# Patient Record
Sex: Female | Born: 1945 | Race: White | Hispanic: No | Marital: Married | Smoking: Never smoker
Health system: Southern US, Community
[De-identification: ages and names within clinical notes are randomized; demographics above are authoritative.]

## PROBLEM LIST (undated history)

## (undated) DIAGNOSIS — E039 Hypothyroidism, unspecified: Secondary | ICD-10-CM

## (undated) DIAGNOSIS — E042 Nontoxic multinodular goiter: Secondary | ICD-10-CM

## (undated) DIAGNOSIS — E038 Other specified hypothyroidism: Secondary | ICD-10-CM

## (undated) DIAGNOSIS — J019 Acute sinusitis, unspecified: Secondary | ICD-10-CM

## (undated) HISTORY — DX: Other specified hypothyroidism: E03.8

## (undated) HISTORY — DX: Nontoxic multinodular goiter: E04.2

## (undated) HISTORY — DX: Hypothyroidism, unspecified: E03.9

## (undated) HISTORY — DX: Acute sinusitis, unspecified: J01.90

---

## 2003-05-11 ENCOUNTER — Other Ambulatory Visit: Admission: RE | Admit: 2003-05-11 | Discharge: 2003-05-11 | Payer: Self-pay | Admitting: Obstetrics and Gynecology

## 2004-01-01 ENCOUNTER — Ambulatory Visit: Payer: Self-pay | Admitting: Internal Medicine

## 2004-03-12 ENCOUNTER — Ambulatory Visit: Payer: Self-pay | Admitting: Internal Medicine

## 2004-05-06 ENCOUNTER — Ambulatory Visit: Payer: Self-pay | Admitting: Internal Medicine

## 2004-06-03 ENCOUNTER — Ambulatory Visit: Payer: Self-pay | Admitting: Internal Medicine

## 2004-12-30 ENCOUNTER — Ambulatory Visit: Payer: Self-pay | Admitting: Internal Medicine

## 2005-01-28 ENCOUNTER — Ambulatory Visit: Payer: Self-pay | Admitting: Internal Medicine

## 2005-07-10 ENCOUNTER — Ambulatory Visit: Payer: Self-pay | Admitting: Internal Medicine

## 2005-07-15 ENCOUNTER — Ambulatory Visit: Payer: Self-pay | Admitting: Internal Medicine

## 2006-04-07 ENCOUNTER — Ambulatory Visit: Payer: Self-pay | Admitting: Internal Medicine

## 2006-04-07 LAB — CONVERTED CEMR LAB
AST: 20 units/L (ref 0–37)
Albumin: 3.9 g/dL (ref 3.5–5.2)
Bilirubin, Direct: 0.1 mg/dL (ref 0.0–0.3)
Cholesterol: 186 mg/dL (ref 0–200)
HDL: 49.2 mg/dL (ref >39.0)
Iron: 75 ug/dL (ref 42–145)
LDL Cholesterol: 117 mg/dL — ABNORMAL HIGH (ref 0–99)
TSH: 0.46 microintl units/mL (ref 0.35–5.50)
Total Bilirubin: 0.8 mg/dL (ref 0.3–1.2)
Total CHOL/HDL Ratio: 3.8
Total Protein: 6.2 g/dL (ref 6.0–8.3)
Transferrin: 192.7 mg/dL — ABNORMAL LOW (ref >=212.0)
VLDL: 20 mg/dL (ref 0–40)

## 2006-07-28 ENCOUNTER — Ambulatory Visit: Payer: Self-pay | Admitting: Internal Medicine

## 2007-03-24 ENCOUNTER — Ambulatory Visit: Payer: Self-pay | Admitting: Internal Medicine

## 2007-03-26 ENCOUNTER — Ambulatory Visit: Payer: Self-pay | Admitting: Internal Medicine

## 2007-03-26 DIAGNOSIS — J019 Acute sinusitis, unspecified: Secondary | ICD-10-CM | POA: Insufficient documentation

## 2007-03-26 DIAGNOSIS — E039 Hypothyroidism, unspecified: Secondary | ICD-10-CM | POA: Insufficient documentation

## 2007-04-01 ENCOUNTER — Encounter: Admission: RE | Admit: 2007-04-01 | Discharge: 2007-04-01 | Payer: Self-pay | Admitting: Internal Medicine

## 2007-05-03 ENCOUNTER — Ambulatory Visit: Payer: Self-pay | Admitting: Endocrinology

## 2007-05-03 DIAGNOSIS — E042 Nontoxic multinodular goiter: Secondary | ICD-10-CM | POA: Insufficient documentation

## 2007-06-28 ENCOUNTER — Ambulatory Visit: Payer: Self-pay | Admitting: Internal Medicine

## 2007-06-28 LAB — CONVERTED CEMR LAB
Direct LDL: 124.5 mg/dL
HDL: 39.7 mg/dL (ref >39.0)
TSH: 0.52 microintl units/mL (ref 0.35–5.50)
Total CHOL/HDL Ratio: 5.7
Triglycerides: 322 mg/dL (ref 0–149)
VLDL: 64 mg/dL — ABNORMAL HIGH (ref 0–40)

## 2007-07-27 ENCOUNTER — Encounter: Payer: Self-pay | Admitting: *Deleted

## 2008-09-19 ENCOUNTER — Encounter (INDEPENDENT_AMBULATORY_CARE_PROVIDER_SITE_OTHER): Payer: Self-pay | Admitting: *Deleted

## 2008-09-19 ENCOUNTER — Telehealth: Payer: Self-pay | Admitting: Internal Medicine

## 2008-09-26 ENCOUNTER — Ambulatory Visit: Payer: Self-pay | Admitting: Internal Medicine

## 2008-10-02 ENCOUNTER — Ambulatory Visit: Payer: Self-pay | Admitting: Internal Medicine

## 2008-10-02 DIAGNOSIS — H6120 Impacted cerumen, unspecified ear: Secondary | ICD-10-CM | POA: Insufficient documentation

## 2009-04-09 IMAGING — US US SOFT TISSUE HEAD/NECK
1 series · 14 of 25 positions shown · non-contrast
Comparison: none

CLINICAL DATA: Question, right thyroid nodule on physical exam.
 THYROID ULTRASOUND:
TECHNIQUE: Ultrasound examination of the thyroid gland and adjacent soft tissue structures was performed.
 No comparison.

[Series 1: us soft tissue head/neck · 0.05mm/px · 14 of 55 slices shown]
[im 1/55]
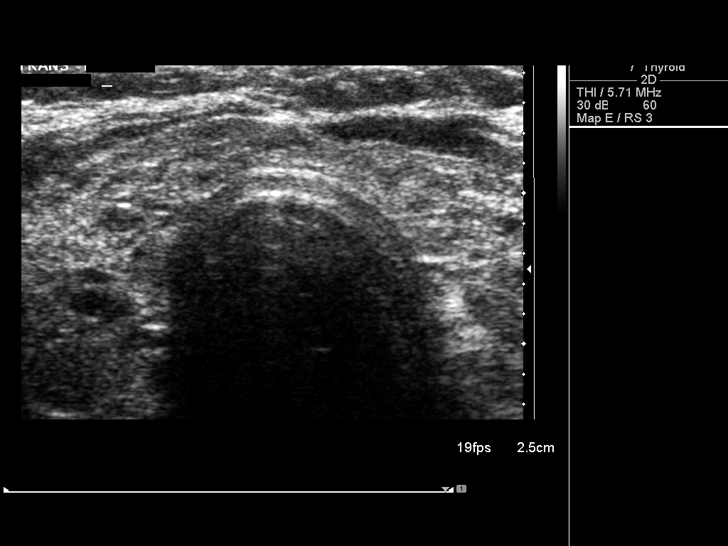
[im 5/55]
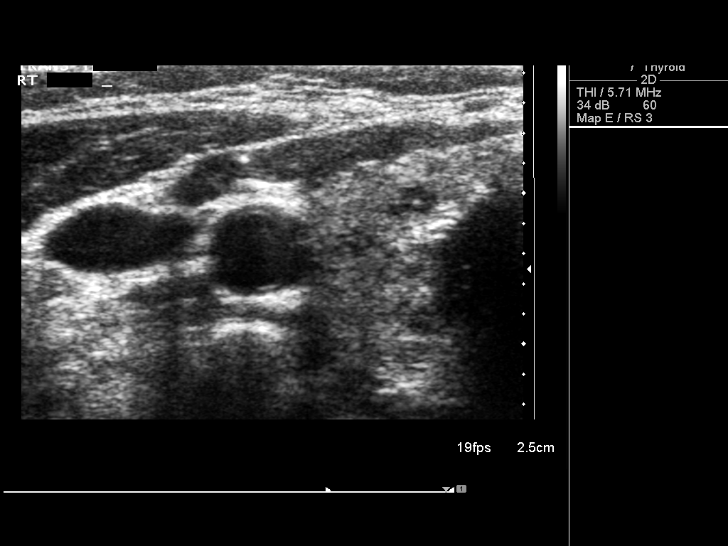
[im 10/55]
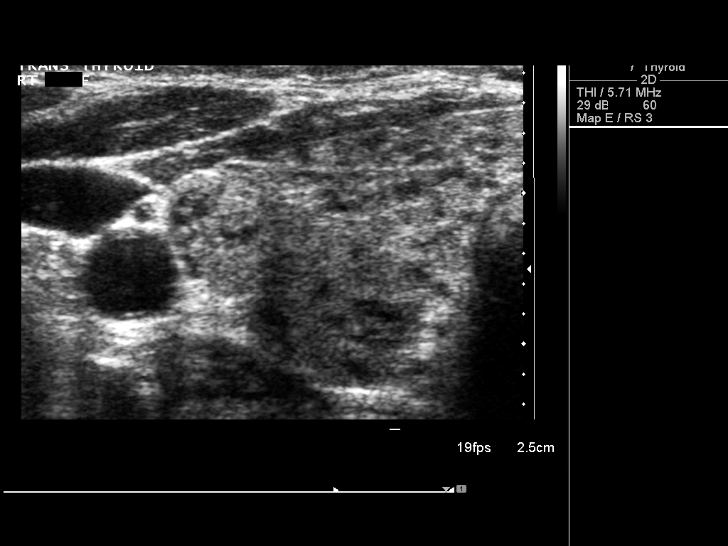
[im 14/55]
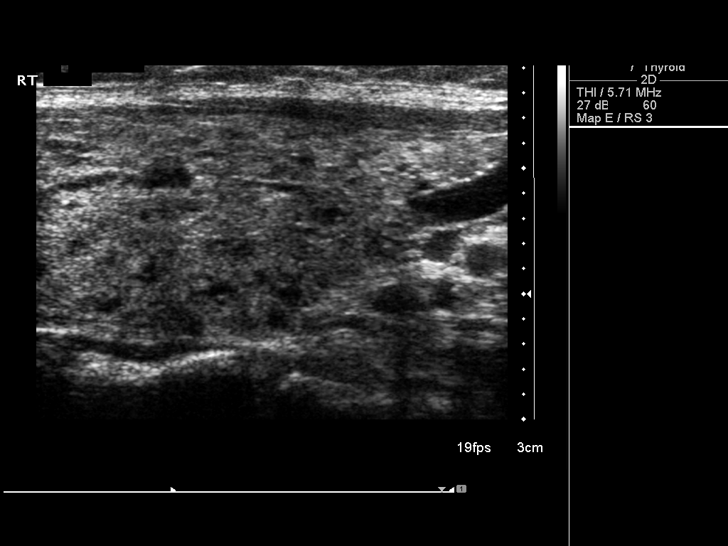
[im 19/55]
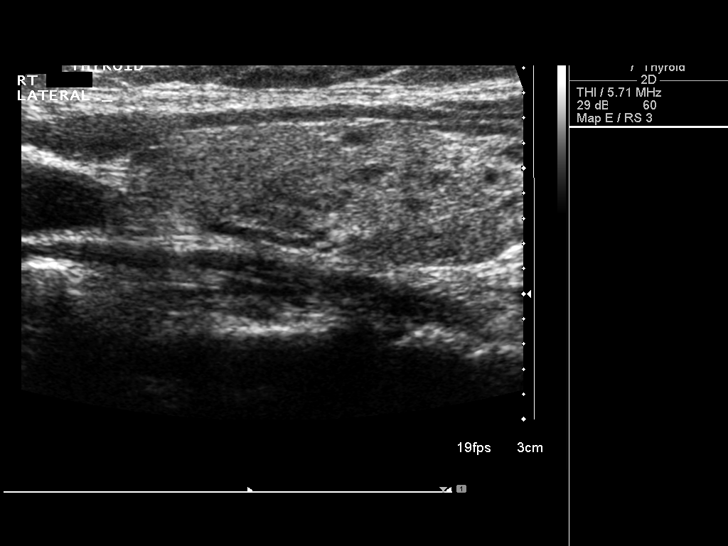
[im 21/55]
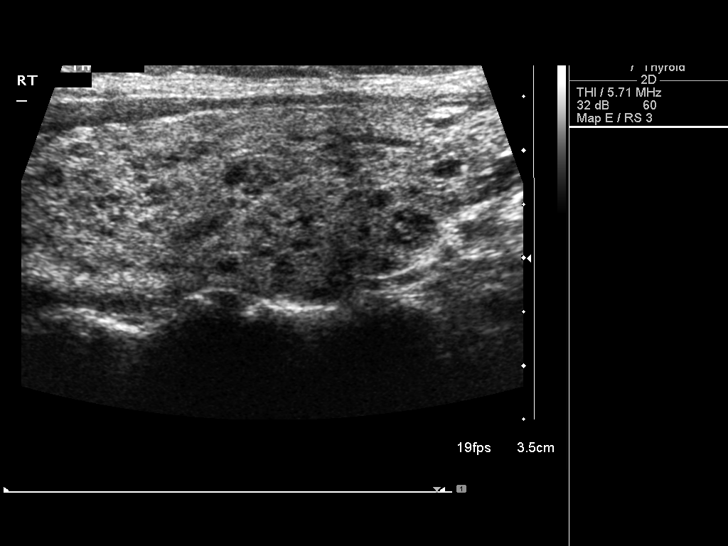
[im 25/55]
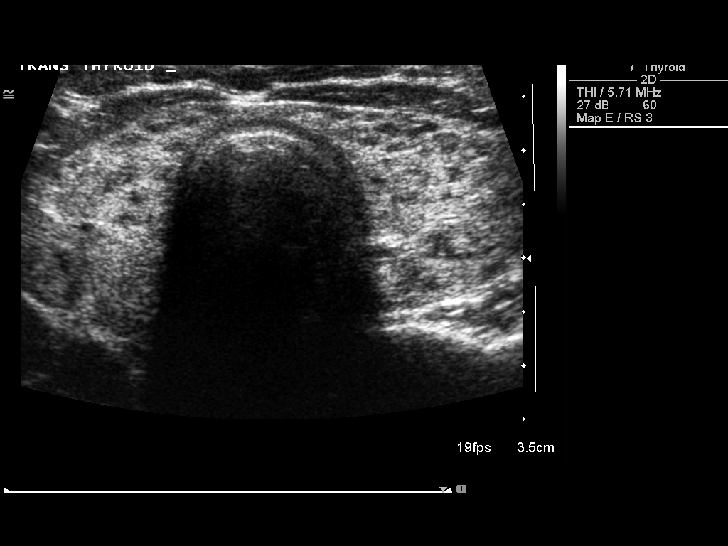
[im 30/55]
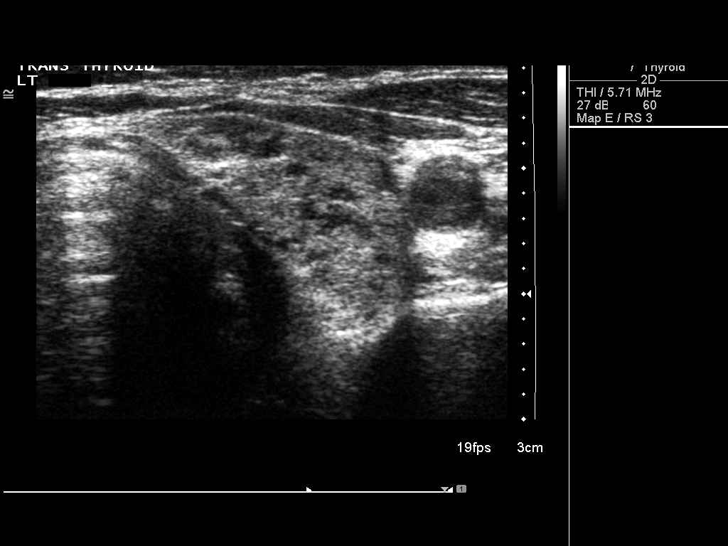
[im 34/55]
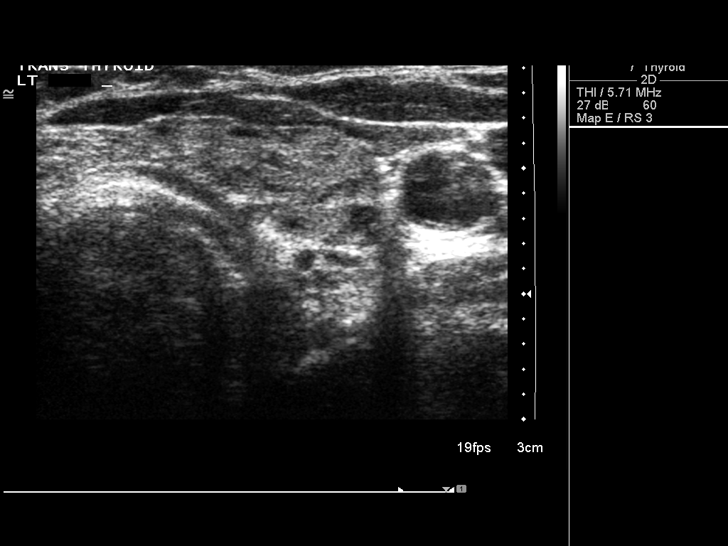
[im 37/55]
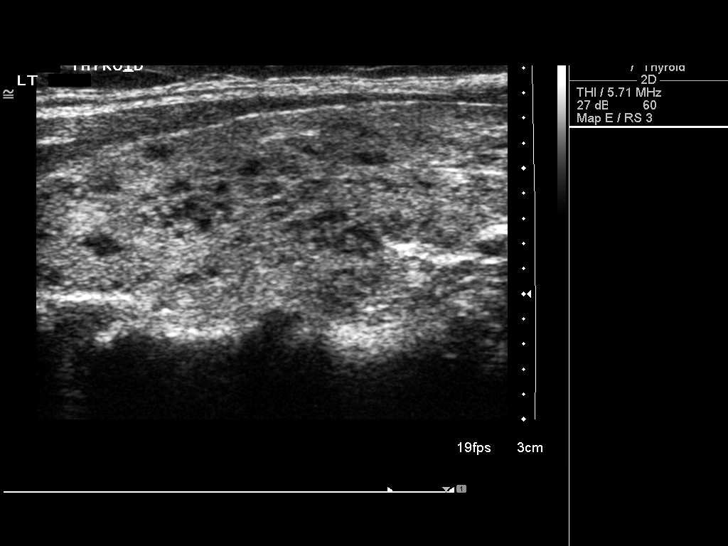
[im 41/55]
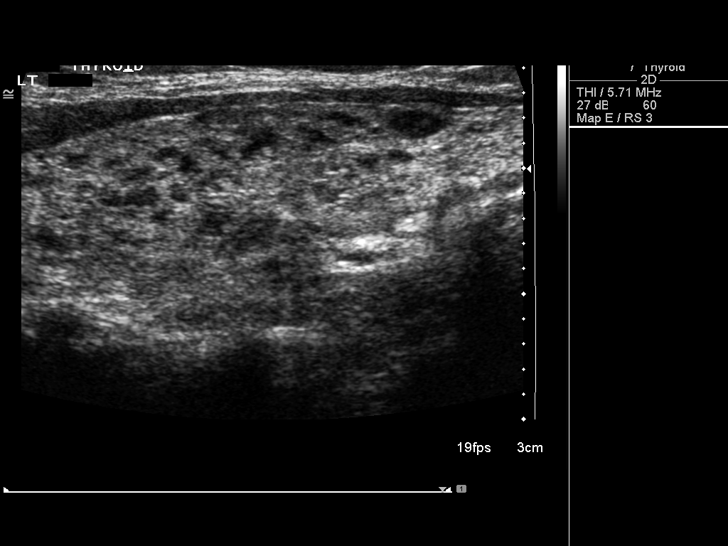
[im 46/55]
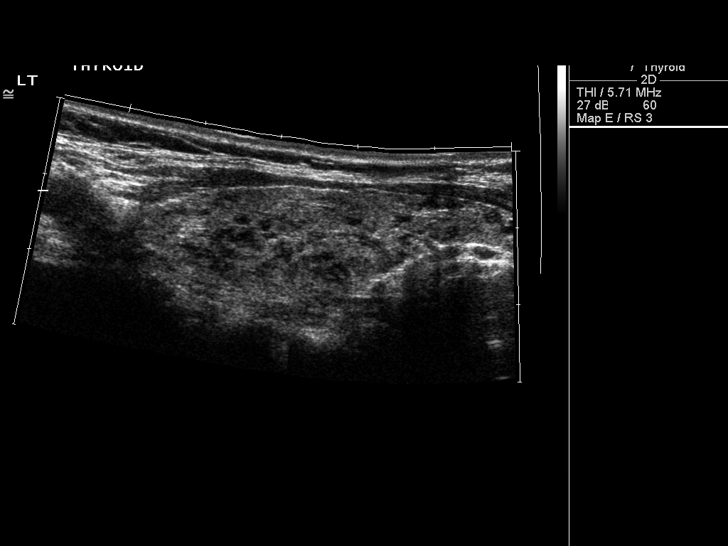
[im 50/55]
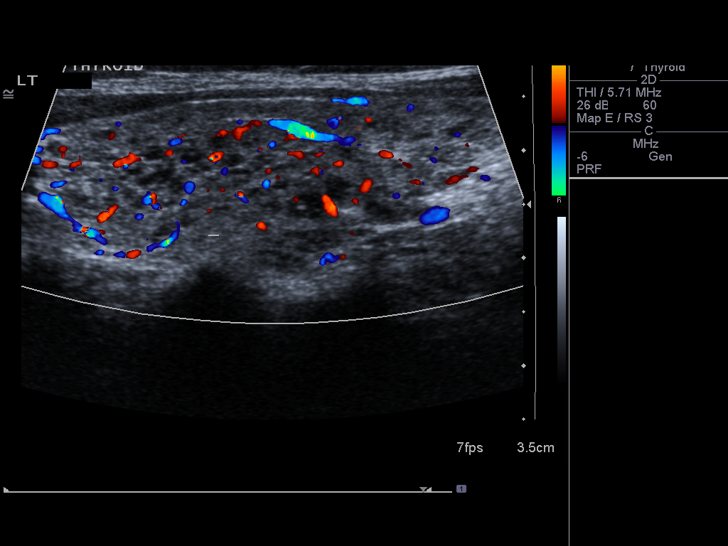
[im 55/55]
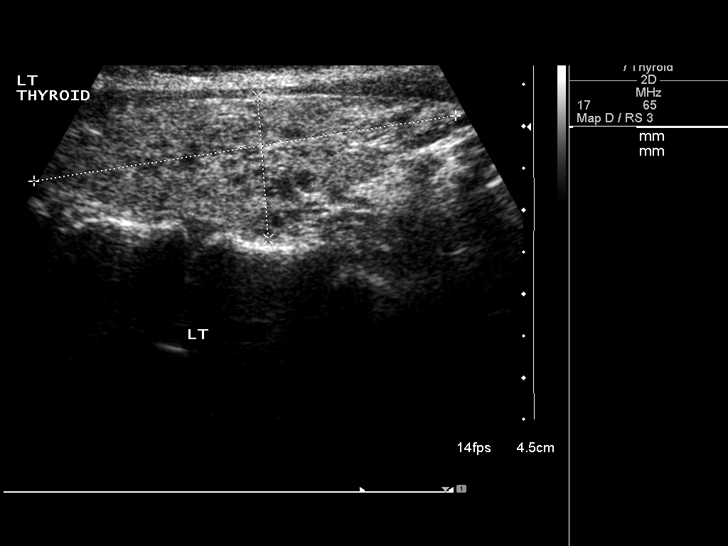

[14 of 25 positions shown; findings below may reference images not displayed]

FINDINGS: The thyroid gland is upper limits of normal in size diffusely with right lobe measuring 5.3 cm long x 1.6 cm AP x 2.1 cm wide and left lobe 5.3 cm long x 1.6 cm AP x 1.7 cm wide and isthmus 4 mm AP.  Thyroid echo texture is diffusely inhomogeneous.  At the medial mid left lobe, is discrete hypoechoic nodule measuring 5 mm long x 3 mm AP x 6 mm wide favoring adenoma.
IMPRESSION: Probable subtle multinodular goiter with 6 mm likely adenoma at the medial mid left lobe.   No other dominant solid lesion seen.

## 2010-01-08 ENCOUNTER — Telehealth: Payer: Self-pay | Admitting: Internal Medicine

## 2010-03-17 ENCOUNTER — Encounter: Payer: Self-pay | Admitting: Internal Medicine

## 2010-03-18 ENCOUNTER — Encounter: Payer: Self-pay | Admitting: Endocrinology

## 2010-03-26 NOTE — Progress Notes (Signed)
Summary: Needs appt  ---- Converted from flag ---- ---- 01/07/2010 2:27 PM, Verdell Face wrote: I called and pt is in [place],and will call in December when she returns to [facility].  ---- 01/07/2010 12:00 PM, Lanier Prude, [facility]) wrote: Please sched pt for OV.  Last seen 09/2008   Thx! ------------------------------

## 2010-06-10 ENCOUNTER — Other Ambulatory Visit: Payer: Self-pay | Admitting: Internal Medicine

## 2010-09-02 ENCOUNTER — Encounter: Payer: Self-pay | Admitting: Internal Medicine

## 2010-09-03 ENCOUNTER — Encounter: Payer: Self-pay | Admitting: Internal Medicine

## 2010-09-04 ENCOUNTER — Encounter: Payer: Self-pay | Admitting: Internal Medicine

## 2010-09-05 ENCOUNTER — Ambulatory Visit (INDEPENDENT_AMBULATORY_CARE_PROVIDER_SITE_OTHER): Payer: PRIVATE HEALTH INSURANCE | Admitting: Internal Medicine

## 2010-09-05 ENCOUNTER — Other Ambulatory Visit (INDEPENDENT_AMBULATORY_CARE_PROVIDER_SITE_OTHER): Payer: PRIVATE HEALTH INSURANCE

## 2010-09-05 ENCOUNTER — Encounter: Payer: Self-pay | Admitting: Internal Medicine

## 2010-09-05 ENCOUNTER — Other Ambulatory Visit: Payer: Self-pay | Admitting: Internal Medicine

## 2010-09-05 VITALS — BP 128/88 | HR 80 | Temp 98.4°F | Resp 16 | Ht 60.0 in | Wt 166.0 lb

## 2010-09-05 DIAGNOSIS — E039 Hypothyroidism, unspecified: Secondary | ICD-10-CM

## 2010-09-05 DIAGNOSIS — Z136 Encounter for screening for cardiovascular disorders: Secondary | ICD-10-CM

## 2010-09-05 DIAGNOSIS — Z0000 Encounter for general adult medical examination without abnormal findings: Secondary | ICD-10-CM

## 2010-09-05 DIAGNOSIS — L219 Seborrheic dermatitis, unspecified: Secondary | ICD-10-CM | POA: Insufficient documentation

## 2010-09-05 LAB — URINALYSIS
Bilirubin Urine: NEGATIVE
Leukocytes, UA: NEGATIVE
Nitrite: NEGATIVE
pH: 7 (ref 5.0–8.0)

## 2010-09-05 LAB — CBC WITH DIFFERENTIAL/PLATELET
Basophils Absolute: 0 10*3/uL (ref 0.0–0.1)
HCT: 44.3 % (ref 36.0–46.0)
Lymphs Abs: 2.3 10*3/uL (ref 0.7–4.0)
Monocytes Absolute: 0.6 10*3/uL (ref 0.1–1.0)
Monocytes Relative: 7.2 % (ref 3.0–12.0)
Neutrophils Relative %: 60.8 % (ref 43.0–77.0)
Platelets: 209 10*3/uL (ref 150.0–400.0)
RDW: 14.6 % (ref 11.5–14.6)
WBC: 7.8 10*3/uL (ref 4.5–10.5)

## 2010-09-05 LAB — COMPREHENSIVE METABOLIC PANEL
AST: 28 U/L (ref 0–37)
Albumin: 4.5 g/dL (ref 3.5–5.2)
Alkaline Phosphatase: 48 U/L (ref 39–117)
BUN: 14 mg/dL (ref 6–23)
Creatinine, Ser: 0.7 mg/dL (ref 0.4–1.2)
Glucose, Bld: 98 mg/dL (ref 70–99)
Potassium: 3.6 mEq/L (ref 3.5–5.1)
Total Bilirubin: 0.6 mg/dL (ref 0.3–1.2)

## 2010-09-05 LAB — LIPID PANEL
Cholesterol: 238 mg/dL — ABNORMAL HIGH (ref 0–200)
HDL: 56.6 mg/dL (ref >39.00)
Total CHOL/HDL Ratio: 4
Triglycerides: 182 mg/dL — ABNORMAL HIGH (ref 0.0–149.0)

## 2010-09-05 LAB — TSH: TSH: 15.36 u[IU]/mL — ABNORMAL HIGH (ref 0.35–5.50)

## 2010-09-05 MED ORDER — ASPIRIN 81 MG PO CHEW: 81.0000 mg | CHEWABLE_TABLET | Freq: Every day | ORAL | Status: DC

## 2010-09-05 MED ORDER — LEVOTHYROXINE SODIUM 50 MCG PO TABS: 50.0000 ug | ORAL_TABLET | Freq: Every day | ORAL | Status: DC

## 2010-09-05 MED ORDER — CLOTRIMAZOLE-BETAMETHASONE 1-0.05 % EX CREA: TOPICAL_CREAM | Freq: Two times a day (BID) | CUTANEOUS | Status: DC

## 2010-09-05 NOTE — Assessment & Plan Note (Signed)
Start Lotrisone

## 2010-09-05 NOTE — Progress Notes (Signed)
  Subjective:    Patient ID: Rachel Howard, female    DOB: [birth year], 65 y.o.   MRN: [id number]  HPI  The patient is here for a wellness exam. The patient has been doing well overall without major physical or psychological issues going on lately.    Review of Systems  Constitutional: Negative.  Negative for fever, chills, diaphoresis, activity change, appetite change, fatigue and unexpected weight change.  HENT: Negative for hearing loss, ear pain, nosebleeds, congestion, sore throat, facial swelling, rhinorrhea, sneezing, mouth sores, trouble swallowing, neck pain, neck stiffness, postnasal drip, sinus pressure and tinnitus.   Eyes: Negative for pain, discharge, redness, itching and visual disturbance.  Respiratory: Negative for cough, chest tightness, shortness of breath, wheezing and stridor.   Cardiovascular: Negative for chest pain, palpitations and leg swelling.  Gastrointestinal: Negative for nausea, diarrhea, constipation, blood in stool, abdominal distention, anal bleeding and rectal pain.  Genitourinary: Negative for dysuria, urgency, frequency, hematuria, flank pain, vaginal bleeding, vaginal discharge, difficulty urinating, genital sores and pelvic pain.  Musculoskeletal: Negative for back pain, joint swelling, arthralgias and gait problem.  Skin: Negative.  Negative for rash.  Neurological: Negative for dizziness, tremors, seizures, syncope, speech difficulty, weakness, numbness and headaches.  Hematological: Negative for adenopathy. Does not bruise/bleed easily.  Psychiatric/Behavioral: Negative for suicidal ideas, behavioral problems, sleep disturbance, dysphoric mood and decreased concentration. The patient is not nervous/anxious.        Objective:   Physical Exam  Constitutional: She appears well-developed and well-nourished. No distress.  HENT:  Head: Normocephalic.  Right Ear: External ear normal.  Left Ear: External ear normal.  Nose: Nose normal.    Mouth/Throat: Oropharynx is clear and moist.  Eyes: Conjunctivae are normal. Pupils are equal, round, and reactive to light. Right eye exhibits no discharge. Left eye exhibits no discharge.  Neck: Normal range of motion. Neck supple. No JVD present. No tracheal deviation present. No thyromegaly present.  Cardiovascular: Normal rate, regular rhythm and normal heart sounds.   Pulmonary/Chest: No stridor. No respiratory distress. She has no wheezes.  Abdominal: Soft. Bowel sounds are normal. She exhibits no distension and no mass. There is no tenderness. There is no rebound and no guarding.  Musculoskeletal: She exhibits no edema and no tenderness.  Lymphadenopathy:    She has no cervical adenopathy.  Neurological: She displays normal reflexes. No cranial nerve deficit. She exhibits normal muscle tone. Coordination normal.  Skin: No rash noted. No erythema.  Psychiatric: She has a normal mood and affect. Her behavior is normal. Judgment and thought content normal.          Assessment & Plan:

## 2010-09-05 NOTE — Assessment & Plan Note (Signed)
Labs ordered. EKG NSR/artifact

## 2010-09-05 NOTE — Assessment & Plan Note (Signed)
She stopped thyroid med in April - ran out

## 2010-09-05 NOTE — Patient Instructions (Signed)
You can try Ginko biloba for memory

## 2010-09-06 ENCOUNTER — Other Ambulatory Visit: Payer: Self-pay | Admitting: *Deleted

## 2010-09-06 ENCOUNTER — Telehealth: Payer: Self-pay | Admitting: Internal Medicine

## 2010-09-06 DIAGNOSIS — E039 Hypothyroidism, unspecified: Secondary | ICD-10-CM

## 2010-09-06 MED ORDER — LEVOTHYROXINE SODIUM 50 MCG PO TABS: 50.0000 ug | ORAL_TABLET | Freq: Every day | ORAL | Status: DC

## 2010-09-06 NOTE — Telephone Encounter (Signed)
Pt informed/copies mailed to pt per her req.

## 2010-09-06 NOTE — Telephone Encounter (Signed)
Rachel Howard, please, inform patient that all labs are normal except for a little elev chol and abn TSH. Pls restart your Levoxyl and check TSH in 2 mo Thx

## 2010-12-03 ENCOUNTER — Other Ambulatory Visit (INDEPENDENT_AMBULATORY_CARE_PROVIDER_SITE_OTHER): Payer: PRIVATE HEALTH INSURANCE

## 2010-12-03 DIAGNOSIS — E039 Hypothyroidism, unspecified: Secondary | ICD-10-CM

## 2010-12-04 ENCOUNTER — Telehealth: Payer: Self-pay | Admitting: Internal Medicine

## 2010-12-04 MED ORDER — LEVOTHYROXINE SODIUM 75 MCG PO TABS: 75.0000 ug | ORAL_TABLET | Freq: Every day | ORAL | Status: DC

## 2010-12-04 NOTE — Telephone Encounter (Signed)
Misty Stanley, please, inform patient that we need to increase  Thx

## 2010-12-05 ENCOUNTER — Telehealth: Payer: Self-pay | Admitting: *Deleted

## 2010-12-05 DIAGNOSIS — E039 Hypothyroidism, unspecified: Secondary | ICD-10-CM

## 2010-12-05 NOTE — Telephone Encounter (Signed)
Pt informed

## 2010-12-05 NOTE — Telephone Encounter (Signed)
Yes, pls: Dec 2012 Thx

## 2010-12-05 NOTE — Telephone Encounter (Signed)
Pt asking when she needs to have TSH checked again? Before next OV in 02/2011?

## 2010-12-06 NOTE — Telephone Encounter (Signed)
Left detailed mess informing pt of below. Orders entered for lab.

## 2011-02-20 ENCOUNTER — Other Ambulatory Visit (INDEPENDENT_AMBULATORY_CARE_PROVIDER_SITE_OTHER): Payer: PRIVATE HEALTH INSURANCE

## 2011-02-20 DIAGNOSIS — E039 Hypothyroidism, unspecified: Secondary | ICD-10-CM

## 2011-02-21 ENCOUNTER — Telehealth: Payer: Self-pay | Admitting: Internal Medicine

## 2011-02-21 NOTE — Telephone Encounter (Signed)
Rachel Howard, please, inform patient that her TSH is nl Thx

## 2011-02-21 NOTE — Telephone Encounter (Signed)
Pt informed

## 2011-03-12 ENCOUNTER — Ambulatory Visit: Payer: PRIVATE HEALTH INSURANCE | Admitting: Internal Medicine

## 2011-09-29 ENCOUNTER — Encounter: Payer: Self-pay | Admitting: Internal Medicine

## 2011-09-29 ENCOUNTER — Ambulatory Visit (INDEPENDENT_AMBULATORY_CARE_PROVIDER_SITE_OTHER): Payer: PRIVATE HEALTH INSURANCE | Admitting: Internal Medicine

## 2011-09-29 VITALS — BP 120/82 | HR 80 | Temp 97.2°F | Resp 16 | Wt 161.0 lb

## 2011-09-29 DIAGNOSIS — R21 Rash and other nonspecific skin eruption: Secondary | ICD-10-CM | POA: Insufficient documentation

## 2011-09-29 DIAGNOSIS — E039 Hypothyroidism, unspecified: Secondary | ICD-10-CM

## 2011-09-29 MED ORDER — METHYLPREDNISOLONE ACETATE 80 MG/ML IJ SUSP
120.0000 mg | Freq: Once | INTRAMUSCULAR | Status: AC
  Administered 2011-09-29: 120 mg via INTRAMUSCULAR

## 2011-09-29 MED ORDER — BETAMETHASONE DIPROPIONATE AUG 0.05 % EX CREA: TOPICAL_CREAM | Freq: Three times a day (TID) | CUTANEOUS | Status: DC

## 2011-09-29 MED ORDER — LORATADINE 10 MG PO TABS: 10.0000 mg | ORAL_TABLET | Freq: Every day | ORAL | Status: DC

## 2011-09-29 NOTE — Progress Notes (Signed)
Subjective:    Patient ID: Rachel Howard, female    DOB: [birth year], 66 y.o.   MRN: [id number]  Rash This is a new problem. The current episode started 1 to 4 weeks ago (7/20). The problem has been gradually worsening since onset. The affected locations include the abdomen, left lower leg and right lower leg. The rash is characterized by blistering, burning and draining. Associated with: she was dx'd w/a spider bite by UC. Pertinent negatives include no congestion, cough, diarrhea, eye pain, fatigue, fever, rhinorrhea, shortness of breath or sore throat. Past treatments include antibiotics, antihistamine, anti-itch cream and oral steroids. The treatment provided mild relief.  F/u hypothyroidism She took Bactrim and Predn taper   BP Readings from Last 3 Encounters:  09/29/11 120/82  09/05/10 128/88  10/02/08 126/74   Wt Readings from Last 3 Encounters:  09/29/11 161 lb (73.029 kg)  09/05/10 166 lb (75.297 kg)  10/02/08 165 lb (74.844 kg)      Review of Systems  Constitutional: Negative.  Negative for fever, chills, diaphoresis, activity change, appetite change, fatigue and unexpected weight change.  HENT: Negative for hearing loss, ear pain, nosebleeds, congestion, sore throat, facial swelling, rhinorrhea, sneezing, mouth sores, trouble swallowing, neck pain, neck stiffness, postnasal drip, sinus pressure and tinnitus.   Eyes: Negative for pain, discharge, redness, itching and visual disturbance.  Respiratory: Negative for cough, chest tightness, shortness of breath, wheezing and stridor.   Cardiovascular: Negative for chest pain, palpitations and leg swelling.  Gastrointestinal: Negative for nausea, diarrhea, constipation, blood in stool, abdominal distention, anal bleeding and rectal pain.  Genitourinary: Negative for dysuria, urgency, frequency, hematuria, flank pain, vaginal bleeding, vaginal discharge, difficulty urinating, genital sores and pelvic pain.  Musculoskeletal:  Negative for back pain, joint swelling, arthralgias and gait problem.  Skin: Positive for rash.  Neurological: Negative for dizziness, tremors, seizures, syncope, speech difficulty, weakness, numbness and headaches.  Hematological: Negative for adenopathy. Does not bruise/bleed easily.  Psychiatric/Behavioral: Negative for suicidal ideas, behavioral problems, disturbed wake/sleep cycle, dysphoric mood and decreased concentration. The patient is not nervous/anxious.        Objective:   Physical Exam  Constitutional: She appears well-developed and well-nourished. No distress.  HENT:  Head: Normocephalic.  Right Ear: External ear normal.  Left Ear: External ear normal.  Nose: Nose normal.  Mouth/Throat: Oropharynx is clear and moist.  Eyes: Conjunctivae are normal. Pupils are equal, round, and reactive to light. Right eye exhibits no discharge. Left eye exhibits no discharge.  Neck: Normal range of motion. Neck supple. No JVD present. No tracheal deviation present. No thyromegaly present.  Cardiovascular: Normal rate, regular rhythm and normal heart sounds.   Pulmonary/Chest: No stridor. No respiratory distress. She has no wheezes.  Abdominal: Soft. Bowel sounds are normal. She exhibits no distension and no mass. There is no tenderness. There is no rebound and no guarding.  Musculoskeletal: She exhibits no edema and no tenderness.  Lymphadenopathy:    She has no cervical adenopathy.  Neurological: She displays normal reflexes. No cranial nerve deficit. She exhibits normal muscle tone. Coordination normal.  Skin: Rash noted. There is erythema.  Psychiatric: She has a normal mood and affect. Her behavior is normal. Judgment and thought content normal.  Extensive papules and scabs 7-15 mm with surrounding erythema, confluent in some areas on B LEs and abdomen, NT  Lab Results  Component Value Date   WBC 7.8 09/05/2010   HGB 15.1* 09/05/2010   HCT 44.3 09/05/2010   PLT 209.0 09/05/2010  GLUCOSE 98 09/05/2010   CHOL 238* 09/05/2010   TRIG 182.0* 09/05/2010   HDL 56.60 09/05/2010   LDLDIRECT 150.8 09/05/2010   LDLCALC 117* 04/07/2006   ALT 28 09/05/2010   AST 28 09/05/2010   NA 137 09/05/2010   K 3.6 09/05/2010   CL 102 09/05/2010   CREATININE 0.7 09/05/2010   BUN 14 09/05/2010   CO2 26 09/05/2010   TSH 2.52 02/20/2011   INR 1.1 RATIO 04/07/2006           Assessment & Plan:

## 2011-09-29 NOTE — Assessment & Plan Note (Signed)
8/13 - contact dermatitis, insect bites No signs of infection at the moment  Depomedrol 120 mg im Loratidine qd Diprolene tid

## 2011-09-29 NOTE — Assessment & Plan Note (Signed)
Lotrisone bid  

## 2011-09-29 NOTE — Assessment & Plan Note (Signed)
Continue with current prescription therapy as reflected on the Med list. TSH was ordered

## 2011-12-04 ENCOUNTER — Other Ambulatory Visit (INDEPENDENT_AMBULATORY_CARE_PROVIDER_SITE_OTHER): Payer: PRIVATE HEALTH INSURANCE

## 2011-12-04 DIAGNOSIS — E039 Hypothyroidism, unspecified: Secondary | ICD-10-CM

## 2011-12-04 DIAGNOSIS — R21 Rash and other nonspecific skin eruption: Secondary | ICD-10-CM

## 2011-12-04 LAB — BASIC METABOLIC PANEL
BUN: 13 mg/dL (ref 6–23)
CO2: 27 mEq/L (ref 19–32)
Chloride: 107 mEq/L (ref 96–112)
Glucose, Bld: 92 mg/dL (ref 70–99)
Potassium: 4.3 mEq/L (ref 3.5–5.1)

## 2011-12-05 ENCOUNTER — Other Ambulatory Visit: Payer: Self-pay | Admitting: Internal Medicine

## 2011-12-05 MED ORDER — LEVOTHYROXINE SODIUM 75 MCG PO TABS: 75.0000 ug | ORAL_TABLET | Freq: Every day | ORAL | Status: DC

## 2012-05-03 ENCOUNTER — Telehealth: Payer: Self-pay | Admitting: Internal Medicine

## 2012-05-03 DIAGNOSIS — E039 Hypothyroidism, unspecified: Secondary | ICD-10-CM

## 2012-05-03 DIAGNOSIS — E785 Hyperlipidemia, unspecified: Secondary | ICD-10-CM

## 2012-05-03 DIAGNOSIS — Z0000 Encounter for general adult medical examination without abnormal findings: Secondary | ICD-10-CM

## 2012-05-03 NOTE — Telephone Encounter (Signed)
The patient came in the office and made a cpe apt for tomorrow am.  She stated she needed a cpe as soon as possible due to a trip she is taking.  She is hoping to go down to the lab for a "tider" test before her cpe apt.  Due to a language barrier, I could not understand exactly what type of test she is hoping for.

## 2012-05-03 NOTE — Telephone Encounter (Signed)
"  thyroid" she meant. OK --- TSH, CMET, CBC, lipids, UA Thx

## 2012-05-04 ENCOUNTER — Encounter: Payer: Self-pay | Admitting: Internal Medicine

## 2012-05-04 ENCOUNTER — Ambulatory Visit (INDEPENDENT_AMBULATORY_CARE_PROVIDER_SITE_OTHER): Payer: PRIVATE HEALTH INSURANCE | Admitting: Internal Medicine

## 2012-05-04 ENCOUNTER — Other Ambulatory Visit (INDEPENDENT_AMBULATORY_CARE_PROVIDER_SITE_OTHER): Payer: PRIVATE HEALTH INSURANCE

## 2012-05-04 VITALS — BP 140/80 | HR 80 | Temp 97.7°F | Resp 16 | Ht 62.0 in | Wt 169.0 lb

## 2012-05-04 DIAGNOSIS — Z0000 Encounter for general adult medical examination without abnormal findings: Secondary | ICD-10-CM

## 2012-05-04 DIAGNOSIS — Z136 Encounter for screening for cardiovascular disorders: Secondary | ICD-10-CM

## 2012-05-04 DIAGNOSIS — E785 Hyperlipidemia, unspecified: Secondary | ICD-10-CM

## 2012-05-04 DIAGNOSIS — E039 Hypothyroidism, unspecified: Secondary | ICD-10-CM

## 2012-05-04 DIAGNOSIS — Z23 Encounter for immunization: Secondary | ICD-10-CM

## 2012-05-04 LAB — HEPATIC FUNCTION PANEL
AST: 29 U/L (ref 0–37)
Albumin: 4 g/dL (ref 3.5–5.2)
Alkaline Phosphatase: 56 U/L (ref 39–117)
Total Bilirubin: 0.9 mg/dL (ref 0.3–1.2)

## 2012-05-04 LAB — CBC WITH DIFFERENTIAL/PLATELET
Basophils Absolute: 0 10*3/uL (ref 0.0–0.1)
Eosinophils Absolute: 0.1 10*3/uL (ref 0.0–0.7)
Lymphocytes Relative: 31.5 % (ref 12.0–46.0)
MCHC: 33.6 g/dL (ref 30.0–36.0)
Monocytes Relative: 6.8 % (ref 3.0–12.0)
Neutrophils Relative %: 60.1 % (ref 43.0–77.0)
RBC: 5 Mil/uL (ref 3.87–5.11)
RDW: 13.9 % (ref 11.5–14.6)

## 2012-05-04 LAB — URINALYSIS, ROUTINE W REFLEX MICROSCOPIC
Nitrite: NEGATIVE
Specific Gravity, Urine: 1.015 (ref 1.000–1.030)
Total Protein, Urine: NEGATIVE
pH: 7 (ref 5.0–8.0)

## 2012-05-04 LAB — LIPID PANEL
Cholesterol: 235 mg/dL — ABNORMAL HIGH (ref 0–200)
HDL: 49.2 mg/dL (ref >39.00)
Total CHOL/HDL Ratio: 5
VLDL: 34.2 mg/dL (ref 0.0–40.0)

## 2012-05-04 LAB — BASIC METABOLIC PANEL
CO2: 26 mEq/L (ref 19–32)
Chloride: 106 mEq/L (ref 96–112)
Potassium: 4.3 mEq/L (ref 3.5–5.1)
Sodium: 139 mEq/L (ref 135–145)

## 2012-05-04 LAB — LDL CHOLESTEROL, DIRECT: Direct LDL: 157.9 mg/dL

## 2012-05-04 LAB — TSH: TSH: 1.98 u[IU]/mL (ref 0.35–5.50)

## 2012-05-04 MED ORDER — LEVOTHYROXINE SODIUM 75 MCG PO TABS: 75.0000 ug | ORAL_TABLET | Freq: Every day | ORAL | Status: DC

## 2012-05-04 NOTE — Assessment & Plan Note (Signed)
Here for medicare wellness/physical  Diet: heart healthy  Physical activity: sedentary  Depression/mood screen: negative  Hearing: intact to whispered voice  Visual acuity: grossly normal, performs annual eye exam  ADLs: capable  Fall risk: none  Home safety: good  Cognitive evaluation: intact to orientation, naming, recall and repetition  EOL planning: adv directives, full code/ I agree  I have personally reviewed and have noted  1. The patient's medical and social history  2. Their use of alcohol, tobacco or illicit drugs  3. Their current medications and supplements  4. The patient's functional ability including ADL's, fall risks, home safety risks and hearing or visual impairment.  5. Diet and physical activities  6. Evidence for depression or mood disorders    Today patient counseled on age appropriate routine health concerns for screening and prevention, each reviewed and up to date or declined. Immunizations reviewed and up to date or declined. Labs ordered and reviewed. Risk factors for depression reviewed and negative. Hearing function and visual acuity are intact. ADLs screened and addressed as needed. Functional ability and level of safety reviewed and appropriate. Education, counseling and referrals performed based on assessed risks today. Patient provided with a copy of personalized plan for preventive services.   She declined all shots, mammo, colonoscopy, GYN exam. Agreed to see an Ophth

## 2012-05-04 NOTE — Progress Notes (Signed)
   Subjective:    HPI  The patient is here for a wellness exam. The patient has been doing well overall without major physical or psychological issues going on lately.  Wt Readings from Last 3 Encounters:  05/04/12 169 lb (76.658 kg)  09/29/11 161 lb (73.029 kg)  09/05/10 166 lb (75.297 kg)   Wt Readings from Last 3 Encounters:  05/04/12 169 lb (76.658 kg)  09/29/11 161 lb (73.029 kg)  09/05/10 166 lb (75.297 kg)      Review of Systems  Constitutional: Negative.  Negative for fever, chills, diaphoresis, activity change, appetite change, fatigue and unexpected weight change.  HENT: Negative for hearing loss, ear pain, nosebleeds, congestion, sore throat, facial swelling, rhinorrhea, sneezing, mouth sores, trouble swallowing, neck pain, neck stiffness, postnasal drip, sinus pressure and tinnitus.   Eyes: Negative for pain, discharge, redness, itching and visual disturbance.  Respiratory: Negative for cough, chest tightness, shortness of breath, wheezing and stridor.   Cardiovascular: Negative for chest pain, palpitations and leg swelling.  Gastrointestinal: Negative for nausea, diarrhea, constipation, blood in stool, abdominal distention, anal bleeding and rectal pain.  Genitourinary: Negative for dysuria, urgency, frequency, hematuria, flank pain, vaginal bleeding, vaginal discharge, difficulty urinating, genital sores and pelvic pain.  Musculoskeletal: Negative for back pain, joint swelling, arthralgias and gait problem.  Skin: Negative.  Negative for rash.  Neurological: Negative for dizziness, tremors, seizures, syncope, speech difficulty, weakness, numbness and headaches.  Hematological: Negative for adenopathy. Does not bruise/bleed easily.  Psychiatric/Behavioral: Negative for suicidal ideas, behavioral problems, sleep disturbance, dysphoric mood and decreased concentration. The patient is not nervous/anxious.        Objective:   Physical Exam  Constitutional: She appears  well-developed and well-nourished. No distress.  HENT:  Head: Normocephalic.  Right Ear: External ear normal.  Left Ear: External ear normal.  Nose: Nose normal.  Mouth/Throat: Oropharynx is clear and moist.  Eyes: Conjunctivae are normal. Pupils are equal, round, and reactive to light. Right eye exhibits no discharge. Left eye exhibits no discharge.  Neck: Normal range of motion. Neck supple. No JVD present. No tracheal deviation present. No thyromegaly present.  Cardiovascular: Normal rate, regular rhythm and normal heart sounds.   Pulmonary/Chest: No stridor. No respiratory distress. She has no wheezes.  Abdominal: Soft. Bowel sounds are normal. She exhibits no distension and no mass. There is no tenderness. There is no rebound and no guarding.  Musculoskeletal: She exhibits no edema and no tenderness.  Lymphadenopathy:    She has no cervical adenopathy.  Neurological: She displays normal reflexes. No cranial nerve deficit. She exhibits normal muscle tone. Coordination normal.  Skin: No rash noted. No erythema.  Psychiatric: She has a normal mood and affect. Her behavior is normal. Judgment and thought content normal.       Lab Results  Component Value Date   WBC 7.8 09/05/2010   HGB 15.1* 09/05/2010   HCT 44.3 09/05/2010   PLT 209.0 09/05/2010   GLUCOSE 92 12/04/2011   CHOL 238* 09/05/2010   TRIG 182.0* 09/05/2010   HDL 56.60 09/05/2010   LDLDIRECT 150.8 09/05/2010   LDLCALC 117* 04/07/2006   ALT 28 09/05/2010   AST 28 09/05/2010   NA 140 12/04/2011   K 4.3 12/04/2011   CL 107 12/04/2011   CREATININE 0.7 12/04/2011   BUN 13 12/04/2011   CO2 27 12/04/2011   TSH 1.71 12/04/2011   INR 1.1 RATIO 04/07/2006      Assessment & Plan:

## 2012-12-30 ENCOUNTER — Other Ambulatory Visit: Payer: Self-pay

## 2013-05-18 ENCOUNTER — Other Ambulatory Visit: Payer: Self-pay | Admitting: Internal Medicine

## 2013-05-23 ENCOUNTER — Other Ambulatory Visit: Payer: Self-pay | Admitting: Internal Medicine

## 2013-05-23 NOTE — Telephone Encounter (Signed)
What pharmacy ?

## 2013-05-23 NOTE — Telephone Encounter (Signed)
Patient is calling to request a refill on her levothyroxine (SYNTHROID, LEVOTHROID) 75 MCG tablet until she is able to get back to [place]. She says that she has only spent 3 days in [place] since her last OV because she has been in [place] and then in [place]. She has made an appointment for next week but is currently out of her thyroid medication. Please advise.

## 2013-05-24 MED ORDER — LEVOTHYROXINE SODIUM 75 MCG PO TABS: 75.0000 ug | ORAL_TABLET | Freq: Every day | ORAL | Status: DC

## 2013-05-24 NOTE — Telephone Encounter (Signed)
The pharmacy is [facility] in [place], [place].  Their number is [phone].  She is out of medicine.

## 2013-05-30 ENCOUNTER — Other Ambulatory Visit (INDEPENDENT_AMBULATORY_CARE_PROVIDER_SITE_OTHER): Payer: Managed Care, Other (non HMO)

## 2013-05-30 ENCOUNTER — Other Ambulatory Visit: Payer: Self-pay | Admitting: *Deleted

## 2013-05-30 DIAGNOSIS — Z0000 Encounter for general adult medical examination without abnormal findings: Secondary | ICD-10-CM

## 2013-05-30 LAB — URINALYSIS, ROUTINE W REFLEX MICROSCOPIC
BILIRUBIN URINE: NEGATIVE
Ketones, ur: NEGATIVE
LEUKOCYTES UA: NEGATIVE
NITRITE: NEGATIVE
Specific Gravity, Urine: 1.01 (ref 1.000–1.030)
TOTAL PROTEIN, URINE-UPE24: NEGATIVE
Urine Glucose: NEGATIVE
Urobilinogen, UA: 0.2 (ref 0.0–1.0)
pH: 7 (ref 5.0–8.0)

## 2013-05-30 LAB — HEPATIC FUNCTION PANEL
ALBUMIN: 3.8 g/dL (ref 3.5–5.2)
ALT: 15 U/L (ref 0–35)
AST: 18 U/L (ref 0–37)
Alkaline Phosphatase: 41 U/L (ref 39–117)
Bilirubin, Direct: 0 mg/dL (ref 0.0–0.3)
Total Bilirubin: 0.7 mg/dL (ref 0.3–1.2)
Total Protein: 6.6 g/dL (ref 6.0–8.3)

## 2013-05-30 LAB — CBC WITH DIFFERENTIAL/PLATELET
Basophils Absolute: 0 10*3/uL (ref 0.0–0.1)
Basophils Relative: 0.5 % (ref 0.0–3.0)
EOS PCT: 1.2 % (ref 0.0–5.0)
Eosinophils Absolute: 0.1 10*3/uL (ref 0.0–0.7)
HEMATOCRIT: 43.9 % (ref 36.0–46.0)
Hemoglobin: 15.1 g/dL — ABNORMAL HIGH (ref 12.0–15.0)
LYMPHS ABS: 2.4 10*3/uL (ref 0.7–4.0)
LYMPHS PCT: 31.6 % (ref 12.0–46.0)
MCHC: 34.3 g/dL (ref 30.0–36.0)
MCV: 88.9 fl (ref 78.0–100.0)
Monocytes Absolute: 0.5 10*3/uL (ref 0.1–1.0)
Monocytes Relative: 6.9 % (ref 3.0–12.0)
NEUTROS PCT: 59.8 % (ref 43.0–77.0)
Neutro Abs: 4.6 10*3/uL (ref 1.4–7.7)
Platelets: 260 10*3/uL (ref 150.0–400.0)
RBC: 4.94 Mil/uL (ref 3.87–5.11)
RDW: 14 % (ref 11.5–14.6)
WBC: 7.7 10*3/uL (ref 4.5–10.5)

## 2013-05-30 LAB — TSH: TSH: 2.66 u[IU]/mL (ref 0.35–5.50)

## 2013-05-30 LAB — LIPID PANEL
CHOLESTEROL: 210 mg/dL — AB (ref 0–200)
HDL: 51.9 mg/dL (ref >39.00)
LDL CALC: 124 mg/dL — AB (ref 0–99)
TRIGLYCERIDES: 173 mg/dL — AB (ref 0.0–149.0)
Total CHOL/HDL Ratio: 4
VLDL: 34.6 mg/dL (ref 0.0–40.0)

## 2013-05-30 LAB — BASIC METABOLIC PANEL
BUN: 18 mg/dL (ref 6–23)
CO2: 27 mEq/L (ref 19–32)
CREATININE: 0.8 mg/dL (ref 0.4–1.2)
Calcium: 8.7 mg/dL (ref 8.4–10.5)
Chloride: 108 mEq/L (ref 96–112)
GFR: 76.85 mL/min (ref >60.00)
GLUCOSE: 94 mg/dL (ref 70–99)
Potassium: 3.9 mEq/L (ref 3.5–5.1)
Sodium: 140 mEq/L (ref 135–145)

## 2013-05-31 ENCOUNTER — Encounter: Payer: Self-pay | Admitting: Internal Medicine

## 2013-05-31 ENCOUNTER — Ambulatory Visit (INDEPENDENT_AMBULATORY_CARE_PROVIDER_SITE_OTHER): Payer: Managed Care, Other (non HMO) | Admitting: Internal Medicine

## 2013-05-31 VITALS — BP 120/80 | HR 77 | Temp 98.3°F | Wt 168.5 lb

## 2013-05-31 DIAGNOSIS — Z0000 Encounter for general adult medical examination without abnormal findings: Secondary | ICD-10-CM

## 2013-05-31 DIAGNOSIS — E039 Hypothyroidism, unspecified: Secondary | ICD-10-CM

## 2013-05-31 MED ORDER — LEVOTHYROXINE SODIUM 75 MCG PO TABS: 75.0000 ug | ORAL_TABLET | Freq: Every day | ORAL | Status: DC

## 2013-05-31 NOTE — Assessment & Plan Note (Addendum)
Here for medicare wellness/physical  Diet: heart healthy  Physical activity: not sedentary  Depression/mood screen: negative  Hearing: intact to whispered voice  Visual acuity: grossly normal, performs annual eye exam  ADLs: capable  Fall risk: none  Home safety: good  Cognitive evaluation: intact to orientation, naming, recall and repetition  EOL planning: adv directives, full code/ I agree  I have personally reviewed and have noted  1. The patient's medical and social history  2. Their use of alcohol, tobacco or illicit drugs  3. Their current medications and supplements  4. The patient's functional ability including ADL's, fall risks, home safety risks and hearing or visual impairment.  5. Diet and physical activities  6. Evidence for depression or mood disorders    Today patient counseled on age appropriate routine health concerns for screening and prevention, each reviewed and up to date or declined. Immunizations reviewed and up to date or declined. Labs ordered and reviewed. Risk factors for depression reviewed and negative. Hearing function and visual acuity are intact. ADLs screened and addressed as needed. Functional ability and level of safety reviewed and appropriate. Education, counseling and referrals performed based on assessed risks today. Patient provided with a copy of personalized plan for preventive services.   She declined all shots, mammo, colonoscopy, cologuard and GYN exam. Agreed to see an Ophth

## 2013-05-31 NOTE — Progress Notes (Signed)
   Subjective:    HPI  The patient is here for a wellness exam.   The patient has been doing well overall without major physical or psychological issues going on lately.  BP Readings from Last 3 Encounters:  05/31/13 120/80  05/04/12 140/80  09/29/11 120/82    Wt Readings from Last 3 Encounters:  05/31/13 168 lb 8 oz (76.431 kg)  05/04/12 169 lb (76.658 kg)  09/29/11 161 lb (73.029 kg)      Review of Systems  Constitutional: Negative.  Negative for fever, chills, diaphoresis, activity change, appetite change, fatigue and unexpected weight change.  HENT: Negative for congestion, ear pain, facial swelling, hearing loss, mouth sores, nosebleeds, postnasal drip, rhinorrhea, sinus pressure, sneezing, sore throat, tinnitus and trouble swallowing.   Eyes: Negative for pain, discharge, redness, itching and visual disturbance.  Respiratory: Negative for cough, chest tightness, shortness of breath, wheezing and stridor.   Cardiovascular: Negative for chest pain, palpitations and leg swelling.  Gastrointestinal: Negative for nausea, diarrhea, constipation, blood in stool, abdominal distention, anal bleeding and rectal pain.  Genitourinary: Negative for dysuria, urgency, frequency, hematuria, flank pain, vaginal bleeding, vaginal discharge, difficulty urinating, genital sores and pelvic pain.  Musculoskeletal: Negative for arthralgias, back pain, gait problem, joint swelling, neck pain and neck stiffness.  Skin: Negative.  Negative for rash.  Neurological: Negative for dizziness, tremors, seizures, syncope, speech difficulty, weakness, numbness and headaches.  Hematological: Negative for adenopathy. Does not bruise/bleed easily.  Psychiatric/Behavioral: Negative for suicidal ideas, behavioral problems, sleep disturbance, dysphoric mood and decreased concentration. The patient is not nervous/anxious.        Objective:   Physical Exam  Constitutional: She appears well-developed and  well-nourished. No distress.  HENT:  Head: Normocephalic.  Right Ear: External ear normal.  Left Ear: External ear normal.  Nose: Nose normal.  Mouth/Throat: Oropharynx is clear and moist.  Eyes: Conjunctivae are normal. Pupils are equal, round, and reactive to light. Right eye exhibits no discharge. Left eye exhibits no discharge.  Neck: Normal range of motion. Neck supple. No JVD present. No tracheal deviation present. No thyromegaly present.  Cardiovascular: Normal rate, regular rhythm and normal heart sounds.   Pulmonary/Chest: No stridor. No respiratory distress. She has no wheezes.  Abdominal: Soft. Bowel sounds are normal. She exhibits no distension and no mass. There is no tenderness. There is no rebound and no guarding.  Musculoskeletal: She exhibits no edema and no tenderness.  Lymphadenopathy:    She has no cervical adenopathy.  Neurological: She displays normal reflexes. No cranial nerve deficit. She exhibits normal muscle tone. Coordination normal.  Skin: No rash noted. No erythema.  Psychiatric: She has a normal mood and affect. Her behavior is normal. Judgment and thought content normal.       Lab Results  Component Value Date   WBC 7.7 05/30/2013   HGB 15.1* 05/30/2013   HCT 43.9 05/30/2013   PLT 260.0 05/30/2013   GLUCOSE 94 05/30/2013   CHOL 210* 05/30/2013   TRIG 173.0* 05/30/2013   HDL 51.90 05/30/2013   LDLDIRECT 157.9 05/04/2012   LDLCALC 124* 05/30/2013   ALT 15 05/30/2013   AST 18 05/30/2013   NA 140 05/30/2013   K 3.9 05/30/2013   CL 108 05/30/2013   CREATININE 0.8 05/30/2013   BUN 18 05/30/2013   CO2 27 05/30/2013   TSH 2.66 05/30/2013   INR 1.1 RATIO 04/07/2006      Assessment & Plan:

## 2013-05-31 NOTE — Assessment & Plan Note (Signed)
Continue with current prescription therapy as reflected on the Med list.  

## 2013-05-31 NOTE — Progress Notes (Signed)
Pre visit review using our clinic review tool, if applicable. No additional management support is needed unless otherwise documented below in the visit note. 

## 2013-12-08 ENCOUNTER — Telehealth: Payer: Self-pay | Admitting: Internal Medicine

## 2013-12-08 MED ORDER — AMOXICILLIN 875 MG PO TABS: 875.0000 mg | ORAL_TABLET | Freq: Two times a day (BID) | ORAL | Status: DC

## 2013-12-08 NOTE — Telephone Encounter (Signed)
Gum infection  in [place]  Amox x 10 d

## 2014-05-17 ENCOUNTER — Telehealth: Payer: Self-pay | Admitting: Internal Medicine

## 2014-05-17 DIAGNOSIS — E039 Hypothyroidism, unspecified: Secondary | ICD-10-CM

## 2014-05-17 NOTE — Telephone Encounter (Signed)
Pt would like to go to lab and get blood work done before her appt on 3/31   For her Tyroid meds

## 2014-05-17 NOTE — Telephone Encounter (Signed)
OK TSH Thx

## 2014-05-18 NOTE — Telephone Encounter (Signed)
Called pt no answer LMOM md has place lab order...Raechel Chute/lmb

## 2014-05-24 ENCOUNTER — Other Ambulatory Visit (INDEPENDENT_AMBULATORY_CARE_PROVIDER_SITE_OTHER): Payer: Managed Care, Other (non HMO)

## 2014-05-24 DIAGNOSIS — E039 Hypothyroidism, unspecified: Secondary | ICD-10-CM

## 2014-05-24 LAB — TSH: TSH: 7.49 u[IU]/mL — AB (ref 0.35–4.50)

## 2014-05-25 ENCOUNTER — Encounter: Payer: Self-pay | Admitting: Internal Medicine

## 2014-05-25 ENCOUNTER — Ambulatory Visit (INDEPENDENT_AMBULATORY_CARE_PROVIDER_SITE_OTHER): Payer: Medicare HMO | Admitting: Internal Medicine

## 2014-05-25 VITALS — BP 118/80 | HR 90 | Wt 171.0 lb

## 2014-05-25 DIAGNOSIS — Z0000 Encounter for general adult medical examination without abnormal findings: Secondary | ICD-10-CM

## 2014-05-25 DIAGNOSIS — E034 Atrophy of thyroid (acquired): Secondary | ICD-10-CM | POA: Diagnosis not present

## 2014-05-25 DIAGNOSIS — E038 Other specified hypothyroidism: Secondary | ICD-10-CM | POA: Diagnosis not present

## 2014-05-25 DIAGNOSIS — E785 Hyperlipidemia, unspecified: Secondary | ICD-10-CM

## 2014-05-25 MED ORDER — LEVOTHYROXINE SODIUM 100 MCG PO TABS: 100.0000 ug | ORAL_TABLET | Freq: Every day | ORAL | Status: DC

## 2014-05-25 MED ORDER — CLOTRIMAZOLE-BETAMETHASONE 1-0.05 % EX CREA: TOPICAL_CREAM | Freq: Two times a day (BID) | CUTANEOUS | Status: DC

## 2014-05-25 NOTE — Patient Instructions (Signed)
Preventive Care for Adults A healthy lifestyle and preventive care can promote health and wellness. Preventive health guidelines for women include the following key practices.  A routine yearly physical is a good way to check with your health care provider about your health and preventive screening. It is a chance to share any concerns and updates on your health and to receive a thorough exam.  Visit your dentist for a routine exam and preventive care every 6 months. Brush your teeth twice a day and floss once a day. Good oral hygiene prevents tooth decay and gum disease.  The frequency of eye exams is based on your age, health, family medical history, use of contact lenses, and other factors. Follow your health care provider's recommendations for frequency of eye exams.  Eat a healthy diet. Foods like vegetables, fruits, whole grains, low-fat dairy products, and lean protein foods contain the nutrients you need without too many calories. Decrease your intake of foods high in solid fats, added sugars, and salt. Eat the right amount of calories for you.Get information about a proper diet from your health care provider, if necessary.  Regular physical exercise is one of the most important things you can do for your health. Most adults should get at least 150 minutes of moderate-intensity exercise (any activity that increases your heart rate and causes you to sweat) each week. In addition, most adults need muscle-strengthening exercises on 2 or more days a week.  Maintain a healthy weight. The body mass index (BMI) is a screening tool to identify possible weight problems. It provides an estimate of body fat based on height and weight. Your health care provider can find your BMI and can help you achieve or maintain a healthy weight.For adults 20 years and older:  A BMI below 18.5 is considered underweight.  A BMI of 18.5 to 24.9 is normal.  A BMI of 25 to 29.9 is considered overweight.  A BMI of  30 and above is considered obese.  Maintain normal blood lipids and cholesterol levels by exercising and minimizing your intake of saturated fat. Eat a balanced diet with plenty of fruit and vegetables. Blood tests for lipids and cholesterol should begin at age 76 and be repeated every 5 years. If your lipid or cholesterol levels are high, you are over 50, or you are at high risk for heart disease, you may need your cholesterol levels checked more frequently.Ongoing high lipid and cholesterol levels should be treated with medicines if diet and exercise are not working.  If you smoke, find out from your health care provider how to quit. If you do not use tobacco, do not start.  Lung cancer screening is recommended for adults aged 22-80 years who are at high risk for developing lung cancer because of a history of smoking. A yearly low-dose CT scan of the lungs is recommended for people who have at least a 30-pack-year history of smoking and are a current smoker or have quit within the past 15 years. A pack year of smoking is smoking an average of 1 pack of cigarettes a day for 1 year (for example: 1 pack a day for 30 years or 2 packs a day for 15 years). Yearly screening should continue until the smoker has stopped smoking for at least 15 years. Yearly screening should be stopped for people who develop a health problem that would prevent them from having lung cancer treatment.  If you are pregnant, do not drink alcohol. If you are breastfeeding,  be very cautious about drinking alcohol. If you are not pregnant and choose to drink alcohol, do not have more than 1 drink per day. One drink is considered to be 12 ounces (355 mL) of beer, 5 ounces (148 mL) of wine, or 1.5 ounces (44 mL) of liquor.  Avoid use of street drugs. Do not share needles with anyone. Ask for help if you need support or instructions about stopping the use of drugs.  High blood pressure causes heart disease and increases the risk of  stroke. Your blood pressure should be checked at least every 1 to 2 years. Ongoing high blood pressure should be treated with medicines if weight loss and exercise do not work.  If you are 3-86 years old, ask your health care provider if you should take aspirin to prevent strokes.  Diabetes screening involves taking a blood sample to check your fasting blood sugar level. This should be done once every 3 years, after age 67, if you are within normal weight and without risk factors for diabetes. Testing should be considered at a younger age or be carried out more frequently if you are overweight and have at least 1 risk factor for diabetes.  Breast cancer screening is essential preventive care for women. You should practice "breast self-awareness." This means understanding the normal appearance and feel of your breasts and may include breast self-examination. Any changes detected, no matter how small, should be reported to a health care provider. Women in their 8s and 30s should have a clinical breast exam (CBE) by a health care provider as part of a regular health exam every 1 to 3 years. After age 70, women should have a CBE every year. Starting at age 25, women should consider having a mammogram (breast X-ray test) every year. Women who have a family history of breast cancer should talk to their health care provider about genetic screening. Women at a high risk of breast cancer should talk to their health care providers about having an MRI and a mammogram every year.  Breast cancer gene (BRCA)-related cancer risk assessment is recommended for women who have family members with BRCA-related cancers. BRCA-related cancers include breast, ovarian, tubal, and peritoneal cancers. Having family members with these cancers may be associated with an increased risk for harmful changes (mutations) in the breast cancer genes BRCA1 and BRCA2. Results of the assessment will determine the need for genetic counseling and  BRCA1 and BRCA2 testing.  Routine pelvic exams to screen for cancer are no longer recommended for nonpregnant women who are considered low risk for cancer of the pelvic organs (ovaries, uterus, and vagina) and who do not have symptoms. Ask your health care provider if a screening pelvic exam is right for you.  If you have had past treatment for cervical cancer or a condition that could lead to cancer, you need Pap tests and screening for cancer for at least 20 years after your treatment. If Pap tests have been discontinued, your risk factors (such as having a new sexual partner) need to be reassessed to determine if screening should be resumed. Some women have medical problems that increase the chance of getting cervical cancer. In these cases, your health care provider may recommend more frequent screening and Pap tests.  The HPV test is an additional test that may be used for cervical cancer screening. The HPV test looks for the virus that can cause the cell changes on the cervix. The cells collected during the Pap test can be  tested for HPV. The HPV test could be used to screen women aged 30 years and older, and should be used in women of any age who have unclear Pap test results. After the age of 30, women should have HPV testing at the same frequency as a Pap test.  Colorectal cancer can be detected and often prevented. Most routine colorectal cancer screening begins at the age of 50 years and continues through age 75 years. However, your health care provider may recommend screening at an earlier age if you have risk factors for colon cancer. On a yearly basis, your health care provider may provide home test kits to check for hidden blood in the stool. Use of a small camera at the end of a tube, to directly examine the colon (sigmoidoscopy or colonoscopy), can detect the earliest forms of colorectal cancer. Talk to your health care provider about this at age 50, when routine screening begins. Direct  exam of the colon should be repeated every 5-10 years through age 75 years, unless early forms of pre-cancerous polyps or small growths are found.  People who are at an increased risk for hepatitis B should be screened for this virus. You are considered at high risk for hepatitis B if:  You were born in a country where hepatitis B occurs often. Talk with your health care provider about which countries are considered high risk.  Your parents were born in a high-risk country and you have not received a shot to protect against hepatitis B (hepatitis B vaccine).  You have HIV or AIDS.  You use needles to inject street drugs.  You live with, or have sex with, someone who has hepatitis B.  You get hemodialysis treatment.  You take certain medicines for conditions like cancer, organ transplantation, and autoimmune conditions.  Hepatitis C blood testing is recommended for all people born from 1945 through 1965 and any individual with known risks for hepatitis C.  Practice safe sex. Use condoms and avoid high-risk sexual practices to reduce the spread of sexually transmitted infections (STIs). STIs include gonorrhea, chlamydia, syphilis, trichomonas, herpes, HPV, and human immunodeficiency virus (HIV). Herpes, HIV, and HPV are viral illnesses that have no cure. They can result in disability, cancer, and death.  You should be screened for sexually transmitted illnesses (STIs) including gonorrhea and chlamydia if:  You are sexually active and are younger than 24 years.  You are older than 24 years and your health care provider tells you that you are at risk for this type of infection.  Your sexual activity has changed since you were last screened and you are at an increased risk for chlamydia or gonorrhea. Ask your health care provider if you are at risk.  If you are at risk of being infected with HIV, it is recommended that you take a prescription medicine daily to prevent HIV infection. This is  called preexposure prophylaxis (PrEP). You are considered at risk if:  You are a heterosexual woman, are sexually active, and are at increased risk for HIV infection.  You take drugs by injection.  You are sexually active with a partner who has HIV.  Talk with your health care provider about whether you are at high risk of being infected with HIV. If you choose to begin PrEP, you should first be tested for HIV. You should then be tested every 3 months for as long as you are taking PrEP.  Osteoporosis is a disease in which the bones lose minerals and strength   with aging. This can result in serious bone fractures or breaks. The risk of osteoporosis can be identified using a bone density scan. Women ages 65 years and over and women at risk for fractures or osteoporosis should discuss screening with their health care providers. Ask your health care provider whether you should take a calcium supplement or vitamin D to reduce the rate of osteoporosis.  Menopause can be associated with physical symptoms and risks. Hormone replacement therapy is available to decrease symptoms and risks. You should talk to your health care provider about whether hormone replacement therapy is right for you.  Use sunscreen. Apply sunscreen liberally and repeatedly throughout the day. You should seek shade when your shadow is shorter than you. Protect yourself by wearing long sleeves, pants, a wide-brimmed hat, and sunglasses year round, whenever you are outdoors.  Once a month, do a whole body skin exam, using a mirror to look at the skin on your back. Tell your health care provider of new moles, moles that have irregular borders, moles that are larger than a pencil eraser, or moles that have changed in shape or color.  Stay current with required vaccines (immunizations).  Influenza vaccine. All adults should be immunized every year.  Tetanus, diphtheria, and acellular pertussis (Td, Tdap) vaccine. Pregnant women should  receive 1 dose of Tdap vaccine during each pregnancy. The dose should be obtained regardless of the length of time since the last dose. Immunization is preferred during the 27th-36th week of gestation. An adult who has not previously received Tdap or who does not know her vaccine status should receive 1 dose of Tdap. This initial dose should be followed by tetanus and diphtheria toxoids (Td) booster doses every 10 years. Adults with an unknown or incomplete history of completing a 3-dose immunization series with Td-containing vaccines should begin or complete a primary immunization series including a Tdap dose. Adults should receive a Td booster every 10 years.  Varicella vaccine. An adult without evidence of immunity to varicella should receive 2 doses or a second dose if she has previously received 1 dose. Pregnant females who do not have evidence of immunity should receive the first dose after pregnancy. This first dose should be obtained before leaving the health care facility. The second dose should be obtained 4-8 weeks after the first dose.  Human papillomavirus (HPV) vaccine. Females aged 13-26 years who have not received the vaccine previously should obtain the 3-dose series. The vaccine is not recommended for use in pregnant females. However, pregnancy testing is not needed before receiving a dose. If a female is found to be pregnant after receiving a dose, no treatment is needed. In that case, the remaining doses should be delayed until after the pregnancy. Immunization is recommended for any person with an immunocompromised condition through the age of 26 years if she did not get any or all doses earlier. During the 3-dose series, the second dose should be obtained 4-8 weeks after the first dose. The third dose should be obtained 24 weeks after the first dose and 16 weeks after the second dose.  Zoster vaccine. One dose is recommended for adults aged 60 years or older unless certain conditions are  present.  Measles, mumps, and rubella (MMR) vaccine. Adults born before 1957 generally are considered immune to measles and mumps. Adults born in 1957 or later should have 1 or more doses of MMR vaccine unless there is a contraindication to the vaccine or there is laboratory evidence of immunity to   each of the three diseases. A routine second dose of MMR vaccine should be obtained at least 28 days after the first dose for students attending postsecondary schools, health care workers, or international travelers. People who received inactivated measles vaccine or an unknown type of measles vaccine during 1963-1967 should receive 2 doses of MMR vaccine. People who received inactivated mumps vaccine or an unknown type of mumps vaccine before 1979 and are at high risk for mumps infection should consider immunization with 2 doses of MMR vaccine. For females of childbearing age, rubella immunity should be determined. If there is no evidence of immunity, females who are not pregnant should be vaccinated. If there is no evidence of immunity, females who are pregnant should delay immunization until after pregnancy. Unvaccinated health care workers born before 1957 who lack laboratory evidence of measles, mumps, or rubella immunity or laboratory confirmation of disease should consider measles and mumps immunization with 2 doses of MMR vaccine or rubella immunization with 1 dose of MMR vaccine.  Pneumococcal 13-valent conjugate (PCV13) vaccine. When indicated, a person who is uncertain of her immunization history and has no record of immunization should receive the PCV13 vaccine. An adult aged 19 years or older who has certain medical conditions and has not been previously immunized should receive 1 dose of PCV13 vaccine. This PCV13 should be followed with a dose of pneumococcal polysaccharide (PPSV23) vaccine. The PPSV23 vaccine dose should be obtained at least 8 weeks after the dose of PCV13 vaccine. An adult aged 19  years or older who has certain medical conditions and previously received 1 or more doses of PPSV23 vaccine should receive 1 dose of PCV13. The PCV13 vaccine dose should be obtained 1 or more years after the last PPSV23 vaccine dose.  Pneumococcal polysaccharide (PPSV23) vaccine. When PCV13 is also indicated, PCV13 should be obtained first. All adults aged 65 years and older should be immunized. An adult younger than age 65 years who has certain medical conditions should be immunized. Any person who resides in a nursing home or long-term care facility should be immunized. An adult smoker should be immunized. People with an immunocompromised condition and certain other conditions should receive both PCV13 and PPSV23 vaccines. People with human immunodeficiency virus (HIV) infection should be immunized as soon as possible after diagnosis. Immunization during chemotherapy or radiation therapy should be avoided. Routine use of PPSV23 vaccine is not recommended for American Indians, [place] Natives, or people younger than 65 years unless there are medical conditions that require PPSV23 vaccine. When indicated, people who have unknown immunization and have no record of immunization should receive PPSV23 vaccine. One-time revaccination 5 years after the first dose of PPSV23 is recommended for people aged 19-64 years who have chronic kidney failure, nephrotic syndrome, asplenia, or immunocompromised conditions. People who received 1-2 doses of PPSV23 before age 65 years should receive another dose of PPSV23 vaccine at age 65 years or later if at least 5 years have passed since the previous dose. Doses of PPSV23 are not needed for people immunized with PPSV23 at or after age 65 years.  Meningococcal vaccine. Adults with asplenia or persistent complement component deficiencies should receive 2 doses of quadrivalent meningococcal conjugate (MenACWY-D) vaccine. The doses should be obtained at least 2 months apart.  Microbiologists working with certain meningococcal bacteria, military recruits, people at risk during an outbreak, and people who travel to or live in countries with a high rate of meningitis should be immunized. A first-year college student up through age   21 years who is living in a residence hall should receive a dose if she did not receive a dose on or after her 16th birthday. Adults who have certain high-risk conditions should receive one or more doses of vaccine.  Hepatitis A vaccine. Adults who wish to be protected from this disease, have certain high-risk conditions, work with hepatitis A-infected animals, work in hepatitis A research labs, or travel to or work in countries with a high rate of hepatitis A should be immunized. Adults who were previously unvaccinated and who anticipate close contact with an international adoptee during the first 60 days after arrival in the [place] from a country with a high rate of hepatitis A should be immunized.  Hepatitis B vaccine. Adults who wish to be protected from this disease, have certain high-risk conditions, may be exposed to blood or other infectious body fluids, are household contacts or sex partners of hepatitis B positive people, are clients or workers in certain care facilities, or travel to or work in countries with a high rate of hepatitis B should be immunized.  Haemophilus influenzae type b (Hib) vaccine. A previously unvaccinated person with asplenia or sickle cell disease or having a scheduled splenectomy should receive 1 dose of Hib vaccine. Regardless of previous immunization, a recipient of a hematopoietic stem cell transplant should receive a 3-dose series 6-12 months after her successful transplant. Hib vaccine is not recommended for adults with HIV infection. Preventive Services / Frequency Ages 64 to 68 years  Blood pressure check.** / Every 1 to 2 years.  Lipid and cholesterol check.** / Every 5 years beginning at age  22.  Clinical breast exam.** / Every 3 years for women in their 88s and 53s.  BRCA-related cancer risk assessment.** / For women who have family members with a BRCA-related cancer (breast, ovarian, tubal, or peritoneal cancers).  Pap test.** / Every 2 years from ages 90 through 51. Every 3 years starting at age 21 through age 56 or 3 with a history of 3 consecutive normal Pap tests.  HPV screening.** / Every 3 years from ages 24 through ages 1 to 46 with a history of 3 consecutive normal Pap tests.  Hepatitis C blood test.** / For any individual with known risks for hepatitis C.  Skin self-exam. / Monthly.  Influenza vaccine. / Every year.  Tetanus, diphtheria, and acellular pertussis (Tdap, Td) vaccine.** / Consult your health care provider. Pregnant women should receive 1 dose of Tdap vaccine during each pregnancy. 1 dose of Td every 10 years.  Varicella vaccine.** / Consult your health care provider. Pregnant females who do not have evidence of immunity should receive the first dose after pregnancy.  HPV vaccine. / 3 doses over 6 months, if 72 and younger. The vaccine is not recommended for use in pregnant females. However, pregnancy testing is not needed before receiving a dose.  Measles, mumps, rubella (MMR) vaccine.** / You need at least 1 dose of MMR if you were born in 1957 or later. You may also need a 2nd dose. For females of childbearing age, rubella immunity should be determined. If there is no evidence of immunity, females who are not pregnant should be vaccinated. If there is no evidence of immunity, females who are pregnant should delay immunization until after pregnancy.  Pneumococcal 13-valent conjugate (PCV13) vaccine.** / Consult your health care provider.  Pneumococcal polysaccharide (PPSV23) vaccine.** / 1 to 2 doses if you smoke cigarettes or if you have certain conditions.  Meningococcal vaccine.** /  1 dose if you are age 19 to 21 years and a first-year college  student living in a residence hall, or have one of several medical conditions, you need to get vaccinated against meningococcal disease. You may also need additional booster doses.  Hepatitis A vaccine.** / Consult your health care provider.  Hepatitis B vaccine.** / Consult your health care provider.  Haemophilus influenzae type b (Hib) vaccine.** / Consult your health care provider. Ages 40 to 64 years  Blood pressure check.** / Every 1 to 2 years.  Lipid and cholesterol check.** / Every 5 years beginning at age 20 years.  Lung cancer screening. / Every year if you are aged 55-80 years and have a 30-pack-year history of smoking and currently smoke or have quit within the past 15 years. Yearly screening is stopped once you have quit smoking for at least 15 years or develop a health problem that would prevent you from having lung cancer treatment.  Clinical breast exam.** / Every year after age 40 years.  BRCA-related cancer risk assessment.** / For women who have family members with a BRCA-related cancer (breast, ovarian, tubal, or peritoneal cancers).  Mammogram.** / Every year beginning at age 40 years and continuing for as long as you are in good health. Consult with your health care provider.  Pap test.** / Every 3 years starting at age 30 years through age 65 or 70 years with a history of 3 consecutive normal Pap tests.  HPV screening.** / Every 3 years from ages 30 years through ages 65 to 70 years with a history of 3 consecutive normal Pap tests.  Fecal occult blood test (FOBT) of stool. / Every year beginning at age 50 years and continuing until age 75 years. You may not need to do this test if you get a colonoscopy every 10 years.  Flexible sigmoidoscopy or colonoscopy.** / Every 5 years for a flexible sigmoidoscopy or every 10 years for a colonoscopy beginning at age 50 years and continuing until age 75 years.  Hepatitis C blood test.** / For all people born from 1945 through  1965 and any individual with known risks for hepatitis C.  Skin self-exam. / Monthly.  Influenza vaccine. / Every year.  Tetanus, diphtheria, and acellular pertussis (Tdap/Td) vaccine.** / Consult your health care provider. Pregnant women should receive 1 dose of Tdap vaccine during each pregnancy. 1 dose of Td every 10 years.  Varicella vaccine.** / Consult your health care provider. Pregnant females who do not have evidence of immunity should receive the first dose after pregnancy.  Zoster vaccine.** / 1 dose for adults aged 60 years or older.  Measles, mumps, rubella (MMR) vaccine.** / You need at least 1 dose of MMR if you were born in 1957 or later. You may also need a 2nd dose. For females of childbearing age, rubella immunity should be determined. If there is no evidence of immunity, females who are not pregnant should be vaccinated. If there is no evidence of immunity, females who are pregnant should delay immunization until after pregnancy.  Pneumococcal 13-valent conjugate (PCV13) vaccine.** / Consult your health care provider.  Pneumococcal polysaccharide (PPSV23) vaccine.** / 1 to 2 doses if you smoke cigarettes or if you have certain conditions.  Meningococcal vaccine.** / Consult your health care provider.  Hepatitis A vaccine.** / Consult your health care provider.  Hepatitis B vaccine.** / Consult your health care provider.  Haemophilus influenzae type b (Hib) vaccine.** / Consult your health care provider. Ages 65   years and over  Blood pressure check.** / Every 1 to 2 years.  Lipid and cholesterol check.** / Every 5 years beginning at age 22 years.  Lung cancer screening. / Every year if you are aged 73-80 years and have a 30-pack-year history of smoking and currently smoke or have quit within the past 15 years. Yearly screening is stopped once you have quit smoking for at least 15 years or develop a health problem that would prevent you from having lung cancer  treatment.  Clinical breast exam.** / Every year after age 4 years.  BRCA-related cancer risk assessment.** / For women who have family members with a BRCA-related cancer (breast, ovarian, tubal, or peritoneal cancers).  Mammogram.** / Every year beginning at age 40 years and continuing for as long as you are in good health. Consult with your health care provider.  Pap test.** / Every 3 years starting at age 9 years through age 34 or 91 years with 3 consecutive normal Pap tests. Testing can be stopped between 65 and 70 years with 3 consecutive normal Pap tests and no abnormal Pap or HPV tests in the past 10 years.  HPV screening.** / Every 3 years from ages 57 years through ages 64 or 45 years with a history of 3 consecutive normal Pap tests. Testing can be stopped between 65 and 70 years with 3 consecutive normal Pap tests and no abnormal Pap or HPV tests in the past 10 years.  Fecal occult blood test (FOBT) of stool. / Every year beginning at age 15 years and continuing until age 17 years. You may not need to do this test if you get a colonoscopy every 10 years.  Flexible sigmoidoscopy or colonoscopy.** / Every 5 years for a flexible sigmoidoscopy or every 10 years for a colonoscopy beginning at age 86 years and continuing until age 71 years.  Hepatitis C blood test.** / For all people born from [birth year] through 1966 and any individual with known risks for hepatitis C.  Osteoporosis screening.** / A one-time screening for women ages 83 years and over and women at risk for fractures or osteoporosis.  Skin self-exam. / Monthly.  Influenza vaccine. / Every year.  Tetanus, diphtheria, and acellular pertussis (Tdap/Td) vaccine.** / 1 dose of Td every 10 years.  Varicella vaccine.** / Consult your health care provider.  Zoster vaccine.** / 1 dose for adults aged 61 years or older.  Pneumococcal 13-valent conjugate (PCV13) vaccine.** / Consult your health care provider.  Pneumococcal  polysaccharide (PPSV23) vaccine.** / 1 dose for all adults aged 28 years and older.  Meningococcal vaccine.** / Consult your health care provider.  Hepatitis A vaccine.** / Consult your health care provider.  Hepatitis B vaccine.** / Consult your health care provider.  Haemophilus influenzae type b (Hib) vaccine.** / Consult your health care provider. ** Family history and personal history of risk and conditions may change your health care provider's recommendations. Document Released: 04/08/2001 Document Revised: 06/27/2013 Document Reviewed: 07/08/2010 [facility] Patient Information 2015 [place], [place]. This information is not intended to replace advice given to you by your health care provider. Make sure you discuss any questions you have with your health care provider.

## 2014-05-25 NOTE — Progress Notes (Signed)
   Subjective:    HPI  The patient is here for a wellness exam.   The patient has been doing well overall without major physical or psychological issues going on lately.  BP Readings from Last 3 Encounters:  05/25/14 118/80  05/31/13 120/80  05/04/12 140/80    Wt Readings from Last 3 Encounters:  05/25/14 171 lb (77.565 kg)  05/31/13 168 lb 8 oz (76.431 kg)  05/04/12 169 lb (76.658 kg)      Review of Systems  Constitutional: Negative.  Negative for fever, chills, diaphoresis, activity change, appetite change, fatigue and unexpected weight change.  HENT: Negative for congestion, ear pain, facial swelling, hearing loss, mouth sores, nosebleeds, postnasal drip, rhinorrhea, sinus pressure, sneezing, sore throat, tinnitus and trouble swallowing.   Eyes: Negative for pain, discharge, redness, itching and visual disturbance.  Respiratory: Negative for cough, chest tightness, shortness of breath, wheezing and stridor.   Cardiovascular: Negative for chest pain, palpitations and leg swelling.  Gastrointestinal: Negative for nausea, diarrhea, constipation, blood in stool, abdominal distention, anal bleeding and rectal pain.  Genitourinary: Negative for dysuria, urgency, frequency, hematuria, flank pain, vaginal bleeding, vaginal discharge, difficulty urinating, genital sores and pelvic pain.  Musculoskeletal: Negative for back pain, joint swelling, arthralgias, gait problem, neck pain and neck stiffness.  Skin: Negative.  Negative for rash.  Neurological: Negative for dizziness, tremors, seizures, syncope, speech difficulty, weakness, numbness and headaches.  Hematological: Negative for adenopathy. Does not bruise/bleed easily.  Psychiatric/Behavioral: Negative for suicidal ideas, behavioral problems, sleep disturbance, dysphoric mood and decreased concentration. The patient is not nervous/anxious.        Objective:   Physical Exam  Constitutional: She appears well-developed and  well-nourished. No distress.  HENT:  Head: Normocephalic.  Right Ear: External ear normal.  Left Ear: External ear normal.  Nose: Nose normal.  Mouth/Throat: Oropharynx is clear and moist.  Eyes: Conjunctivae are normal. Pupils are equal, round, and reactive to light. Right eye exhibits no discharge. Left eye exhibits no discharge.  Neck: Normal range of motion. Neck supple. No JVD present. No tracheal deviation present. No thyromegaly present.  Cardiovascular: Normal rate, regular rhythm and normal heart sounds.   Pulmonary/Chest: No stridor. No respiratory distress. She has no wheezes.  Abdominal: Soft. Bowel sounds are normal. She exhibits no distension and no mass. There is no tenderness. There is no rebound and no guarding.  Musculoskeletal: She exhibits no edema or tenderness.  Lymphadenopathy:    She has no cervical adenopathy.  Neurological: She displays normal reflexes. No cranial nerve deficit. She exhibits normal muscle tone. Coordination normal.  Skin: No rash noted. No erythema.  Psychiatric: She has a normal mood and affect. Her behavior is normal. Judgment and thought content normal.  Thyroid - no nodules     Lab Results  Component Value Date   WBC 7.7 05/30/2013   HGB 15.1* 05/30/2013   HCT 43.9 05/30/2013   PLT 260.0 05/30/2013   GLUCOSE 94 05/30/2013   CHOL 210* 05/30/2013   TRIG 173.0* 05/30/2013   HDL 51.90 05/30/2013   LDLDIRECT 157.9 05/04/2012   LDLCALC 124* 05/30/2013   ALT 15 05/30/2013   AST 18 05/30/2013   NA 140 05/30/2013   K 3.9 05/30/2013   CL 108 05/30/2013   CREATININE 0.8 05/30/2013   BUN 18 05/30/2013   CO2 27 05/30/2013   TSH 7.49* 05/24/2014   INR 1.1 RATIO 04/07/2006      Assessment & Plan:

## 2014-05-25 NOTE — Assessment & Plan Note (Signed)
We increased Levothroid to 100 mcg/d TSH, FT4 in June

## 2014-05-25 NOTE — Progress Notes (Signed)
Pre visit review using our clinic review tool, if applicable. No additional management support is needed unless otherwise documented below in the visit note. 

## 2014-05-25 NOTE — Assessment & Plan Note (Signed)
Here for medicare wellness/physical  Diet: heart healthy  Physical activity: not sedentary  Depression/mood screen: negative  Hearing: intact to whispered voice  Visual acuity: grossly normal, performs annual eye exam  ADLs: capable  Fall risk: none  Home safety: good  Cognitive evaluation: intact to orientation, naming, recall and repetition  EOL planning: adv directives, full code/ I agree  I have personally reviewed and have noted  1. The patient's medical and social history  2. Their use of alcohol, tobacco or illicit drugs  3. Their current medications and supplements  4. The patient's functional ability including ADL's, fall risks, home safety risks and hearing or visual impairment.  5. Diet and physical activities  6. Evidence for depression or mood disorders    Today patient counseled on age appropriate routine health concerns for screening and prevention, each reviewed and up to date or declined. Immunizations reviewed and up to date or declined. Labs ordered and reviewed. Risk factors for depression reviewed and negative. Hearing function and visual acuity are intact. ADLs screened and addressed as needed. Functional ability and level of safety reviewed and appropriate. Education, counseling and referrals performed based on assessed risks today. Patient provided with a copy of personalized plan for preventive services.   She declined all shots, mammo, colonoscopy, GYN exam. Agreed to see an Ophth  [place]

## 2014-08-23 ENCOUNTER — Other Ambulatory Visit (INDEPENDENT_AMBULATORY_CARE_PROVIDER_SITE_OTHER): Payer: Medicare HMO

## 2014-08-23 DIAGNOSIS — Z0000 Encounter for general adult medical examination without abnormal findings: Secondary | ICD-10-CM | POA: Diagnosis not present

## 2014-08-23 DIAGNOSIS — E038 Other specified hypothyroidism: Secondary | ICD-10-CM | POA: Diagnosis not present

## 2014-08-23 DIAGNOSIS — E034 Atrophy of thyroid (acquired): Secondary | ICD-10-CM | POA: Diagnosis not present

## 2014-08-23 DIAGNOSIS — E785 Hyperlipidemia, unspecified: Secondary | ICD-10-CM | POA: Diagnosis not present

## 2014-08-23 LAB — CBC WITH DIFFERENTIAL/PLATELET
BASOS ABS: 0 10*3/uL (ref 0.0–0.1)
Basophils Relative: 0.5 % (ref 0.0–3.0)
EOS PCT: 1.5 % (ref 0.0–5.0)
Eosinophils Absolute: 0.1 10*3/uL (ref 0.0–0.7)
HCT: 44.5 % (ref 36.0–46.0)
HEMOGLOBIN: 15.1 g/dL — AB (ref 12.0–15.0)
LYMPHS PCT: 37 % (ref 12.0–46.0)
Lymphs Abs: 3 10*3/uL (ref 0.7–4.0)
MCHC: 34 g/dL (ref 30.0–36.0)
MCV: 88.2 fl (ref 78.0–100.0)
MONOS PCT: 8 % (ref 3.0–12.0)
Monocytes Absolute: 0.6 10*3/uL (ref 0.1–1.0)
NEUTROS ABS: 4.2 10*3/uL (ref 1.4–7.7)
Neutrophils Relative %: 53 % (ref 43.0–77.0)
Platelets: 228 10*3/uL (ref 150.0–400.0)
RBC: 5.05 Mil/uL (ref 3.87–5.11)
RDW: 14.4 % (ref 11.5–15.5)
WBC: 8 10*3/uL (ref 4.0–10.5)

## 2014-08-23 LAB — BASIC METABOLIC PANEL
BUN: 21 mg/dL (ref 6–23)
CO2: 26 mEq/L (ref 19–32)
Calcium: 9.2 mg/dL (ref 8.4–10.5)
Chloride: 106 mEq/L (ref 96–112)
Creatinine, Ser: 0.89 mg/dL (ref 0.40–1.20)
GFR: 66.73 mL/min (ref >60.00)
Glucose, Bld: 111 mg/dL — ABNORMAL HIGH (ref 70–99)
Potassium: 4.2 mEq/L (ref 3.5–5.1)
Sodium: 141 mEq/L (ref 135–145)

## 2014-08-23 LAB — URINALYSIS, ROUTINE W REFLEX MICROSCOPIC
Bilirubin Urine: NEGATIVE
KETONES UR: NEGATIVE
LEUKOCYTES UA: NEGATIVE
NITRITE: NEGATIVE
PH: 6 (ref 5.0–8.0)
Specific Gravity, Urine: >=1.03 — AB (ref 1.000–1.030)
Total Protein, Urine: NEGATIVE
Urine Glucose: NEGATIVE
Urobilinogen, UA: 0.2 (ref 0.0–1.0)

## 2014-08-23 LAB — LIPID PANEL
CHOLESTEROL: 212 mg/dL — AB (ref 0–200)
HDL: 47.3 mg/dL (ref >39.00)
LDL Cholesterol: 129 mg/dL — ABNORMAL HIGH (ref 0–99)
NonHDL: 164.7
Total CHOL/HDL Ratio: 4
Triglycerides: 179 mg/dL — ABNORMAL HIGH (ref 0.0–149.0)
VLDL: 35.8 mg/dL (ref 0.0–40.0)

## 2014-08-23 LAB — TSH: TSH: 0.1 u[IU]/mL — ABNORMAL LOW (ref 0.35–4.50)

## 2014-08-23 LAB — T4, FREE: Free T4: 1.12 ng/dL (ref 0.60–1.60)

## 2014-08-23 LAB — HEPATIC FUNCTION PANEL
ALK PHOS: 45 U/L (ref 39–117)
ALT: 14 U/L (ref 0–35)
AST: 17 U/L (ref 0–37)
Albumin: 3.9 g/dL (ref 3.5–5.2)
BILIRUBIN DIRECT: 0.1 mg/dL (ref 0.0–0.3)
Total Bilirubin: 0.5 mg/dL (ref 0.2–1.2)
Total Protein: 6.5 g/dL (ref 6.0–8.3)

## 2014-08-23 MED ORDER — CIPROFLOXACIN HCL 250 MG PO TABS: 250.0000 mg | ORAL_TABLET | Freq: Two times a day (BID) | ORAL | Status: DC

## 2014-10-03 ENCOUNTER — Telehealth: Payer: Self-pay

## 2014-10-03 NOTE — Telephone Encounter (Signed)
Left message advising patient that mammogram is due, or if already completed, call our office and we will update records

## 2015-06-01 ENCOUNTER — Telehealth: Payer: Self-pay | Admitting: Internal Medicine

## 2015-06-01 MED ORDER — LEVOTHYROXINE SODIUM 100 MCG PO TABS: 100.0000 ug | ORAL_TABLET | Freq: Every day | ORAL | Status: DC

## 2015-06-01 NOTE — Telephone Encounter (Signed)
Notified husband rx sent to [facility]...Raechel Chute/lmb

## 2015-06-01 NOTE — Telephone Encounter (Signed)
States wife is out of town visiting their kids.  She is out of medication.  She is [phone] in [place].  Patient would like to get today.  Please follow up with spouse when sent

## 2015-06-15 ENCOUNTER — Ambulatory Visit (INDEPENDENT_AMBULATORY_CARE_PROVIDER_SITE_OTHER): Payer: Medicare HMO | Admitting: Internal Medicine

## 2015-06-15 ENCOUNTER — Encounter: Payer: Self-pay | Admitting: Internal Medicine

## 2015-06-15 ENCOUNTER — Other Ambulatory Visit (INDEPENDENT_AMBULATORY_CARE_PROVIDER_SITE_OTHER): Payer: Medicare HMO

## 2015-06-15 VITALS — BP 124/76 | HR 74 | Ht 62.0 in | Wt 166.0 lb

## 2015-06-15 DIAGNOSIS — Z0000 Encounter for general adult medical examination without abnormal findings: Secondary | ICD-10-CM

## 2015-06-15 DIAGNOSIS — E042 Nontoxic multinodular goiter: Secondary | ICD-10-CM

## 2015-06-15 DIAGNOSIS — E034 Atrophy of thyroid (acquired): Secondary | ICD-10-CM

## 2015-06-15 LAB — CBC WITH DIFFERENTIAL/PLATELET
BASOS ABS: 0.1 10*3/uL (ref 0.0–0.1)
Basophils Relative: 0.8 % (ref 0.0–3.0)
EOS ABS: 0.1 10*3/uL (ref 0.0–0.7)
Eosinophils Relative: 1.2 % (ref 0.0–5.0)
HEMATOCRIT: 43.5 % (ref 36.0–46.0)
Hemoglobin: 14.7 g/dL (ref 12.0–15.0)
LYMPHS PCT: 36.9 % (ref 12.0–46.0)
Lymphs Abs: 2.9 10*3/uL (ref 0.7–4.0)
MCHC: 33.8 g/dL (ref 30.0–36.0)
MCV: 88.5 fl (ref 78.0–100.0)
MONOS PCT: 9.3 % (ref 3.0–12.0)
Monocytes Absolute: 0.7 10*3/uL (ref 0.1–1.0)
Neutro Abs: 4.1 10*3/uL (ref 1.4–7.7)
Neutrophils Relative %: 51.8 % (ref 43.0–77.0)
Platelets: 228 10*3/uL (ref 150.0–400.0)
RBC: 4.92 Mil/uL (ref 3.87–5.11)
RDW: 14.4 % (ref 11.5–15.5)
WBC: 7.9 10*3/uL (ref 4.0–10.5)

## 2015-06-15 LAB — URINALYSIS
BILIRUBIN URINE: NEGATIVE
Leukocytes, UA: NEGATIVE
NITRITE: NEGATIVE
PH: 5.5 (ref 5.0–8.0)
Specific Gravity, Urine: 1.025 (ref 1.000–1.030)
TOTAL PROTEIN, URINE-UPE24: NEGATIVE
Urine Glucose: NEGATIVE
Urobilinogen, UA: 0.2 (ref 0.0–1.0)

## 2015-06-15 LAB — HEPATIC FUNCTION PANEL
ALBUMIN: 3.9 g/dL (ref 3.5–5.2)
ALT: 13 U/L (ref 0–35)
AST: 15 U/L (ref 0–37)
Alkaline Phosphatase: 46 U/L (ref 39–117)
Bilirubin, Direct: 0.1 mg/dL (ref 0.0–0.3)
TOTAL PROTEIN: 6.4 g/dL (ref 6.0–8.3)
Total Bilirubin: 0.4 mg/dL (ref 0.2–1.2)

## 2015-06-15 LAB — BASIC METABOLIC PANEL
BUN: 21 mg/dL (ref 6–23)
CALCIUM: 9 mg/dL (ref 8.4–10.5)
CO2: 29 meq/L (ref 19–32)
Chloride: 107 mEq/L (ref 96–112)
Creatinine, Ser: 0.78 mg/dL (ref 0.40–1.20)
GFR: 77.52 mL/min (ref >60.00)
Glucose, Bld: 95 mg/dL (ref 70–99)
Potassium: 4.3 mEq/L (ref 3.5–5.1)
SODIUM: 142 meq/L (ref 135–145)

## 2015-06-15 LAB — LIPID PANEL
CHOL/HDL RATIO: 5
Cholesterol: 194 mg/dL (ref 0–200)
HDL: 41.4 mg/dL (ref >39.00)
LDL Cholesterol: 116 mg/dL — ABNORMAL HIGH (ref 0–99)
NONHDL: 152.3
TRIGLYCERIDES: 183 mg/dL — AB (ref 0.0–149.0)
VLDL: 36.6 mg/dL (ref 0.0–40.0)

## 2015-06-15 LAB — TSH: TSH: 0.12 u[IU]/mL — AB (ref 0.35–4.50)

## 2015-06-15 MED ORDER — LEVOTHYROXINE SODIUM 100 MCG PO TABS: 100.0000 ug | ORAL_TABLET | Freq: Every day | ORAL | Status: DC

## 2015-06-15 NOTE — Patient Instructions (Signed)
Preventive Care for Adults, Female A healthy lifestyle and preventive care can promote health and wellness. Preventive health guidelines for women include the following key practices.  A routine yearly physical is a good way to check with your health care provider about your health and preventive screening. It is a chance to share any concerns and updates on your health and to receive a thorough exam.  Visit your dentist for a routine exam and preventive care every 6 months. Brush your teeth twice a day and floss once a day. Good oral hygiene prevents tooth decay and gum disease.  The frequency of eye exams is based on your age, health, family medical history, use of contact lenses, and other factors. Follow your health care provider's recommendations for frequency of eye exams.  Eat a healthy diet. Foods like vegetables, fruits, whole grains, low-fat dairy products, and lean protein foods contain the nutrients you need without too many calories. Decrease your intake of foods high in solid fats, added sugars, and salt. Eat the right amount of calories for you.Get information about a proper diet from your health care provider, if necessary.  Regular physical exercise is one of the most important things you can do for your health. Most adults should get at least 150 minutes of moderate-intensity exercise (any activity that increases your heart rate and causes you to sweat) each week. In addition, most adults need muscle-strengthening exercises on 2 or more days a week.  Maintain a healthy weight. The body mass index (BMI) is a screening tool to identify possible weight problems. It provides an estimate of body fat based on height and weight. Your health care provider can find your BMI and can help you achieve or maintain a healthy weight.For adults 20 years and older:  A BMI below 18.5 is considered underweight.  A BMI of 18.5 to 24.9 is normal.  A BMI of 25 to 29.9 is considered overweight.  A  BMI of 30 and above is considered obese.  Maintain normal blood lipids and cholesterol levels by exercising and minimizing your intake of saturated fat. Eat a balanced diet with plenty of fruit and vegetables. Blood tests for lipids and cholesterol should begin at age 45 and be repeated every 5 years. If your lipid or cholesterol levels are high, you are over 50, or you are at high risk for heart disease, you may need your cholesterol levels checked more frequently.Ongoing high lipid and cholesterol levels should be treated with medicines if diet and exercise are not working.  If you smoke, find out from your health care provider how to quit. If you do not use tobacco, do not start.  Lung cancer screening is recommended for adults aged 45-80 years who are at high risk for developing lung cancer because of a history of smoking. A yearly low-dose CT scan of the lungs is recommended for people who have at least a 30-pack-year history of smoking and are a current smoker or have quit within the past 15 years. A pack year of smoking is smoking an average of 1 pack of cigarettes a day for 1 year (for example: 1 pack a day for 30 years or 2 packs a day for 15 years). Yearly screening should continue until the smoker has stopped smoking for at least 15 years. Yearly screening should be stopped for people who develop a health problem that would prevent them from having lung cancer treatment.  If you are pregnant, do not drink alcohol. If you are  breastfeeding, be very cautious about drinking alcohol. If you are not pregnant and choose to drink alcohol, do not have more than 1 drink per day. One drink is considered to be 12 ounces (355 mL) of beer, 5 ounces (148 mL) of wine, or 1.5 ounces (44 mL) of liquor.  Avoid use of street drugs. Do not share needles with anyone. Ask for help if you need support or instructions about stopping the use of drugs.  High blood pressure causes heart disease and increases the risk  of stroke. Your blood pressure should be checked at least every 1 to 2 years. Ongoing high blood pressure should be treated with medicines if weight loss and exercise do not work.  If you are 55-79 years old, ask your health care provider if you should take aspirin to prevent strokes.  Diabetes screening is done by taking a blood sample to check your blood glucose level after you have not eaten for a certain period of time (fasting). If you are not overweight and you do not have risk factors for diabetes, you should be screened once every 3 years starting at age 45. If you are overweight or obese and you are 40-70 years of age, you should be screened for diabetes every year as part of your cardiovascular risk assessment.  Breast cancer screening is essential preventive care for women. You should practice "breast self-awareness." This means understanding the normal appearance and feel of your breasts and may include breast self-examination. Any changes detected, no matter how small, should be reported to a health care provider. Women in their 20s and 30s should have a clinical breast exam (CBE) by a health care provider as part of a regular health exam every 1 to 3 years. After age 40, women should have a CBE every year. Starting at age 40, women should consider having a mammogram (breast X-ray test) every year. Women who have a family history of breast cancer should talk to their health care provider about genetic screening. Women at a high risk of breast cancer should talk to their health care providers about having an MRI and a mammogram every year.  Breast cancer gene (BRCA)-related cancer risk assessment is recommended for women who have family members with BRCA-related cancers. BRCA-related cancers include breast, ovarian, tubal, and peritoneal cancers. Having family members with these cancers may be associated with an increased risk for harmful changes (mutations) in the breast cancer genes BRCA1 and  BRCA2. Results of the assessment will determine the need for genetic counseling and BRCA1 and BRCA2 testing.  Your health care provider may recommend that you be screened regularly for cancer of the pelvic organs (ovaries, uterus, and vagina). This screening involves a pelvic examination, including checking for microscopic changes to the surface of your cervix (Pap test). You may be encouraged to have this screening done every 3 years, beginning at age 21.  For women ages 30-65, health care providers may recommend pelvic exams and Pap testing every 3 years, or they may recommend the Pap and pelvic exam, combined with testing for human papilloma virus (HPV), every 5 years. Some types of HPV increase your risk of cervical cancer. Testing for HPV may also be done on women of any age with unclear Pap test results.  Other health care providers may not recommend any screening for nonpregnant women who are considered low risk for pelvic cancer and who do not have symptoms. Ask your health care provider if a screening pelvic exam is right for   you.  If you have had past treatment for cervical cancer or a condition that could lead to cancer, you need Pap tests and screening for cancer for at least 20 years after your treatment. If Pap tests have been discontinued, your risk factors (such as having a new sexual partner) need to be reassessed to determine if screening should resume. Some women have medical problems that increase the chance of getting cervical cancer. In these cases, your health care provider may recommend more frequent screening and Pap tests.  Colorectal cancer can be detected and often prevented. Most routine colorectal cancer screening begins at the age of 50 years and continues through age 75 years. However, your health care provider may recommend screening at an earlier age if you have risk factors for colon cancer. On a yearly basis, your health care provider may provide home test kits to check  for hidden blood in the stool. Use of a small camera at the end of a tube, to directly examine the colon (sigmoidoscopy or colonoscopy), can detect the earliest forms of colorectal cancer. Talk to your health care provider about this at age 50, when routine screening begins. Direct exam of the colon should be repeated every 5-10 years through age 75 years, unless early forms of precancerous polyps or small growths are found.  People who are at an increased risk for hepatitis B should be screened for this virus. You are considered at high risk for hepatitis B if:  You were born in a country where hepatitis B occurs often. Talk with your health care provider about which countries are considered high risk.  Your parents were born in a high-risk country and you have not received a shot to protect against hepatitis B (hepatitis B vaccine).  You have HIV or AIDS.  You use needles to inject street drugs.  You live with, or have sex with, someone who has hepatitis B.  You get hemodialysis treatment.  You take certain medicines for conditions like cancer, organ transplantation, and autoimmune conditions.  Hepatitis C blood testing is recommended for all people born from 1945 through 1965 and any individual with known risks for hepatitis C.  Practice safe sex. Use condoms and avoid high-risk sexual practices to reduce the spread of sexually transmitted infections (STIs). STIs include gonorrhea, chlamydia, syphilis, trichomonas, herpes, HPV, and human immunodeficiency virus (HIV). Herpes, HIV, and HPV are viral illnesses that have no cure. They can result in disability, cancer, and death.  You should be screened for sexually transmitted illnesses (STIs) including gonorrhea and chlamydia if:  You are sexually active and are younger than 24 years.  You are older than 24 years and your health care provider tells you that you are at risk for this type of infection.  Your sexual activity has changed  since you were last screened and you are at an increased risk for chlamydia or gonorrhea. Ask your health care provider if you are at risk.  If you are at risk of being infected with HIV, it is recommended that you take a prescription medicine daily to prevent HIV infection. This is called preexposure prophylaxis (PrEP). You are considered at risk if:  You are sexually active and do not regularly use condoms or know the HIV status of your partner(s).  You take drugs by injection.  You are sexually active with a partner who has HIV.  Talk with your health care provider about whether you are at high risk of being infected with HIV. If   you choose to begin PrEP, you should first be tested for HIV. You should then be tested every 3 months for as long as you are taking PrEP.  Osteoporosis is a disease in which the bones lose minerals and strength with aging. This can result in serious bone fractures or breaks. The risk of osteoporosis can be identified using a bone density scan. Women ages 67 years and over and women at risk for fractures or osteoporosis should discuss screening with their health care providers. Ask your health care provider whether you should take a calcium supplement or vitamin D to reduce the rate of osteoporosis.  Menopause can be associated with physical symptoms and risks. Hormone replacement therapy is available to decrease symptoms and risks. You should talk to your health care provider about whether hormone replacement therapy is right for you.  Use sunscreen. Apply sunscreen liberally and repeatedly throughout the day. You should seek shade when your shadow is shorter than you. Protect yourself by wearing long sleeves, pants, a wide-brimmed hat, and sunglasses year round, whenever you are outdoors.  Once a month, do a whole body skin exam, using a mirror to look at the skin on your back. Tell your health care provider of new moles, moles that have irregular borders, moles that  are larger than a pencil eraser, or moles that have changed in shape or color.  Stay current with required vaccines (immunizations).  Influenza vaccine. All adults should be immunized every year.  Tetanus, diphtheria, and acellular pertussis (Td, Tdap) vaccine. Pregnant women should receive 1 dose of Tdap vaccine during each pregnancy. The dose should be obtained regardless of the length of time since the last dose. Immunization is preferred during the 27th-36th week of gestation. An adult who has not previously received Tdap or who does not know her vaccine status should receive 1 dose of Tdap. This initial dose should be followed by tetanus and diphtheria toxoids (Td) booster doses every 10 years. Adults with an unknown or incomplete history of completing a 3-dose immunization series with Td-containing vaccines should begin or complete a primary immunization series including a Tdap dose. Adults should receive a Td booster every 10 years.  Varicella vaccine. An adult without evidence of immunity to varicella should receive 2 doses or a second dose if she has previously received 1 dose. Pregnant females who do not have evidence of immunity should receive the first dose after pregnancy. This first dose should be obtained before leaving the health care facility. The second dose should be obtained 4-8 weeks after the first dose.  Human papillomavirus (HPV) vaccine. Females aged 13-26 years who have not received the vaccine previously should obtain the 3-dose series. The vaccine is not recommended for use in pregnant females. However, pregnancy testing is not needed before receiving a dose. If a female is found to be pregnant after receiving a dose, no treatment is needed. In that case, the remaining doses should be delayed until after the pregnancy. Immunization is recommended for any person with an immunocompromised condition through the age of 61 years if she did not get any or all doses earlier. During the  3-dose series, the second dose should be obtained 4-8 weeks after the first dose. The third dose should be obtained 24 weeks after the first dose and 16 weeks after the second dose.  Zoster vaccine. One dose is recommended for adults aged 30 years or older unless certain conditions are present.  Measles, mumps, and rubella (MMR) vaccine. Adults born  before 1957 generally are considered immune to measles and mumps. Adults born in 1957 or later should have 1 or more doses of MMR vaccine unless there is a contraindication to the vaccine or there is laboratory evidence of immunity to each of the three diseases. A routine second dose of MMR vaccine should be obtained at least 28 days after the first dose for students attending postsecondary schools, health care workers, or international travelers. People who received inactivated measles vaccine or an unknown type of measles vaccine during 1963-1967 should receive 2 doses of MMR vaccine. People who received inactivated mumps vaccine or an unknown type of mumps vaccine before 1979 and are at high risk for mumps infection should consider immunization with 2 doses of MMR vaccine. For females of childbearing age, rubella immunity should be determined. If there is no evidence of immunity, females who are not pregnant should be vaccinated. If there is no evidence of immunity, females who are pregnant should delay immunization until after pregnancy. Unvaccinated health care workers born before 1957 who lack laboratory evidence of measles, mumps, or rubella immunity or laboratory confirmation of disease should consider measles and mumps immunization with 2 doses of MMR vaccine or rubella immunization with 1 dose of MMR vaccine.  Pneumococcal 13-valent conjugate (PCV13) vaccine. When indicated, a person who is uncertain of his immunization history and has no record of immunization should receive the PCV13 vaccine. All adults 65 years of age and older should receive this  vaccine. An adult aged 19 years or older who has certain medical conditions and has not been previously immunized should receive 1 dose of PCV13 vaccine. This PCV13 should be followed with a dose of pneumococcal polysaccharide (PPSV23) vaccine. Adults who are at high risk for pneumococcal disease should obtain the PPSV23 vaccine at least 8 weeks after the dose of PCV13 vaccine. Adults older than 71 years of age who have normal immune system function should obtain the PPSV23 vaccine dose at least 1 year after the dose of PCV13 vaccine.  Pneumococcal polysaccharide (PPSV23) vaccine. When PCV13 is also indicated, PCV13 should be obtained first. All adults aged 65 years and older should be immunized. An adult younger than age 65 years who has certain medical conditions should be immunized. Any person who resides in a nursing home or long-term care facility should be immunized. An adult smoker should be immunized. People with an immunocompromised condition and certain other conditions should receive both PCV13 and PPSV23 vaccines. People with human immunodeficiency virus (HIV) infection should be immunized as soon as possible after diagnosis. Immunization during chemotherapy or radiation therapy should be avoided. Routine use of PPSV23 vaccine is not recommended for American Indians, [place] Natives, or people younger than 65 years unless there are medical conditions that require PPSV23 vaccine. When indicated, people who have unknown immunization and have no record of immunization should receive PPSV23 vaccine. One-time revaccination 5 years after the first dose of PPSV23 is recommended for people aged 19-64 years who have chronic kidney failure, nephrotic syndrome, asplenia, or immunocompromised conditions. People who received 1-2 doses of PPSV23 before age 65 years should receive another dose of PPSV23 vaccine at age 65 years or later if at least 5 years have passed since the previous dose. Doses of PPSV23 are not  needed for people immunized with PPSV23 at or after age 65 years.  Meningococcal vaccine. Adults with asplenia or persistent complement component deficiencies should receive 2 doses of quadrivalent meningococcal conjugate (MenACWY-D) vaccine. The doses should be obtained   at least 2 months apart. Microbiologists working with certain meningococcal bacteria, [place] recruits, people at risk during an outbreak, and people who travel to or live in countries with a high rate of meningitis should be immunized. A first-year college student up through age 34 years who is living in a residence hall should receive a dose if she did not receive a dose on or after her 16th birthday. Adults who have certain high-risk conditions should receive one or more doses of vaccine.  Hepatitis A vaccine. Adults who wish to be protected from this disease, have certain high-risk conditions, work with hepatitis A-infected animals, work in hepatitis A research labs, or travel to or work in countries with a high rate of hepatitis A should be immunized. Adults who were previously unvaccinated and who anticipate close contact with an international adoptee during the first 60 days after arrival in the [place] from a country with a high rate of hepatitis A should be immunized.  Hepatitis B vaccine. Adults who wish to be protected from this disease, have certain high-risk conditions, may be exposed to blood or other infectious body fluids, are household contacts or sex partners of hepatitis B positive people, are clients or workers in certain care facilities, or travel to or work in countries with a high rate of hepatitis B should be immunized.  Haemophilus influenzae type b (Hib) vaccine. A previously unvaccinated person with asplenia or sickle cell disease or having a scheduled splenectomy should receive 1 dose of Hib vaccine. Regardless of previous immunization, a recipient of a hematopoietic stem cell transplant should receive a  3-dose series 6-12 months after her successful transplant. Hib vaccine is not recommended for adults with HIV infection. Preventive Services / Frequency Ages 35 to 4 years  Blood pressure check.** / Every 3-5 years.  Lipid and cholesterol check.** / Every 5 years beginning at age 60.  Clinical breast exam.** / Every 3 years for women in their 71s and 10s.  BRCA-related cancer risk assessment.** / For women who have family members with a BRCA-related cancer (breast, ovarian, tubal, or peritoneal cancers).  Pap test.** / Every 2 years from ages 76 through 26. Every 3 years starting at age 61 through age 76 or 93 with a history of 3 consecutive normal Pap tests.  HPV screening.** / Every 3 years from ages 37 through ages 60 to 51 with a history of 3 consecutive normal Pap tests.  Hepatitis C blood test.** / For any individual with known risks for hepatitis C.  Skin self-exam. / Monthly.  Influenza vaccine. / Every year.  Tetanus, diphtheria, and acellular pertussis (Tdap, Td) vaccine.** / Consult your health care provider. Pregnant women should receive 1 dose of Tdap vaccine during each pregnancy. 1 dose of Td every 10 years.  Varicella vaccine.** / Consult your health care provider. Pregnant females who do not have evidence of immunity should receive the first dose after pregnancy.  HPV vaccine. / 3 doses over 6 months, if 93 and younger. The vaccine is not recommended for use in pregnant females. However, pregnancy testing is not needed before receiving a dose.  Measles, mumps, rubella (MMR) vaccine.** / You need at least 1 dose of MMR if you were born in 1957 or later. You may also need a 2nd dose. For females of childbearing age, rubella immunity should be determined. If there is no evidence of immunity, females who are not pregnant should be vaccinated. If there is no evidence of immunity, females who are  pregnant should delay immunization until after pregnancy.  Pneumococcal  13-valent conjugate (PCV13) vaccine.** / Consult your health care provider.  Pneumococcal polysaccharide (PPSV23) vaccine.** / 1 to 2 doses if you smoke cigarettes or if you have certain conditions.  Meningococcal vaccine.** / 1 dose if you are age 68 to 8 years and a Market researcher living in a residence hall, or have one of several medical conditions, you need to get vaccinated against meningococcal disease. You may also need additional booster doses.  Hepatitis A vaccine.** / Consult your health care provider.  Hepatitis B vaccine.** / Consult your health care provider.  Haemophilus influenzae type b (Hib) vaccine.** / Consult your health care provider. Ages 7 to 53 years  Blood pressure check.** / Every year.  Lipid and cholesterol check.** / Every 5 years beginning at age 25 years.  Lung cancer screening. / Every year if you are aged 11-80 years and have a 30-pack-year history of smoking and currently smoke or have quit within the past 15 years. Yearly screening is stopped once you have quit smoking for at least 15 years or develop a health problem that would prevent you from having lung cancer treatment.  Clinical breast exam.** / Every year after age 48 years.  BRCA-related cancer risk assessment.** / For women who have family members with a BRCA-related cancer (breast, ovarian, tubal, or peritoneal cancers).  Mammogram.** / Every year beginning at age 41 years and continuing for as long as you are in good health. Consult with your health care provider.  Pap test.** / Every 3 years starting at age 65 years through age 37 or 70 years with a history of 3 consecutive normal Pap tests.  HPV screening.** / Every 3 years from ages 72 years through ages 60 to 40 years with a history of 3 consecutive normal Pap tests.  Fecal occult blood test (FOBT) of stool. / Every year beginning at age 21 years and continuing until age 5 years. You may not need to do this test if you get  a colonoscopy every 10 years.  Flexible sigmoidoscopy or colonoscopy.** / Every 5 years for a flexible sigmoidoscopy or every 10 years for a colonoscopy beginning at age 35 years and continuing until age 48 years.  Hepatitis C blood test.** / For all people born from [birth year] through 1965 and any individual with known risks for hepatitis C.  Skin self-exam. / Monthly.  Influenza vaccine. / Every year.  Tetanus, diphtheria, and acellular pertussis (Tdap/Td) vaccine.** / Consult your health care provider. Pregnant women should receive 1 dose of Tdap vaccine during each pregnancy. 1 dose of Td every 10 years.  Varicella vaccine.** / Consult your health care provider. Pregnant females who do not have evidence of immunity should receive the first dose after pregnancy.  Zoster vaccine.** / 1 dose for adults aged 30 years or older.  Measles, mumps, rubella (MMR) vaccine.** / You need at least 1 dose of MMR if you were born in 1957 or later. You may also need a second dose. For females of childbearing age, rubella immunity should be determined. If there is no evidence of immunity, females who are not pregnant should be vaccinated. If there is no evidence of immunity, females who are pregnant should delay immunization until after pregnancy.  Pneumococcal 13-valent conjugate (PCV13) vaccine.** / Consult your health care provider.  Pneumococcal polysaccharide (PPSV23) vaccine.** / 1 to 2 doses if you smoke cigarettes or if you have certain conditions.  Meningococcal vaccine.** /  Consult your health care provider.  Hepatitis A vaccine.** / Consult your health care provider.  Hepatitis B vaccine.** / Consult your health care provider.  Haemophilus influenzae type b (Hib) vaccine.** / Consult your health care provider. Ages 64 years and over  Blood pressure check.** / Every year.  Lipid and cholesterol check.** / Every 5 years beginning at age 23 years.  Lung cancer screening. / Every year if you  are aged 16-80 years and have a 30-pack-year history of smoking and currently smoke or have quit within the past 15 years. Yearly screening is stopped once you have quit smoking for at least 15 years or develop a health problem that would prevent you from having lung cancer treatment.  Clinical breast exam.** / Every year after age 74 years.  BRCA-related cancer risk assessment.** / For women who have family members with a BRCA-related cancer (breast, ovarian, tubal, or peritoneal cancers).  Mammogram.** / Every year beginning at age 44 years and continuing for as long as you are in good health. Consult with your health care provider.  Pap test.** / Every 3 years starting at age 58 years through age 22 or 39 years with 3 consecutive normal Pap tests. Testing can be stopped between 65 and 70 years with 3 consecutive normal Pap tests and no abnormal Pap or HPV tests in the past 10 years.  HPV screening.** / Every 3 years from ages 64 years through ages 70 or 61 years with a history of 3 consecutive normal Pap tests. Testing can be stopped between 65 and 70 years with 3 consecutive normal Pap tests and no abnormal Pap or HPV tests in the past 10 years.  Fecal occult blood test (FOBT) of stool. / Every year beginning at age 40 years and continuing until age 27 years. You may not need to do this test if you get a colonoscopy every 10 years.  Flexible sigmoidoscopy or colonoscopy.** / Every 5 years for a flexible sigmoidoscopy or every 10 years for a colonoscopy beginning at age 7 years and continuing until age 32 years.  Hepatitis C blood test.** / For all people born from [birth year] through 1966 and any individual with known risks for hepatitis C.  Osteoporosis screening.** / A one-time screening for women ages 30 years and over and women at risk for fractures or osteoporosis.  Skin self-exam. / Monthly.  Influenza vaccine. / Every year.  Tetanus, diphtheria, and acellular pertussis (Tdap/Td)  vaccine.** / 1 dose of Td every 10 years.  Varicella vaccine.** / Consult your health care provider.  Zoster vaccine.** / 1 dose for adults aged 35 years or older.  Pneumococcal 13-valent conjugate (PCV13) vaccine.** / Consult your health care provider.  Pneumococcal polysaccharide (PPSV23) vaccine.** / 1 dose for all adults aged 46 years and older.  Meningococcal vaccine.** / Consult your health care provider.  Hepatitis A vaccine.** / Consult your health care provider.  Hepatitis B vaccine.** / Consult your health care provider.  Haemophilus influenzae type b (Hib) vaccine.** / Consult your health care provider. ** Family history and personal history of risk and conditions may change your health care provider's recommendations.   This information is not intended to replace advice given to you by your health care provider. Make sure you discuss any questions you have with your health care provider.   Document Released: 04/08/2001 Document Revised: 03/03/2014 Document Reviewed: 07/08/2010 Elsevier Interactive Patient Education [employer].

## 2015-06-15 NOTE — Assessment & Plan Note (Signed)
Check US in June

## 2015-06-15 NOTE — Assessment & Plan Note (Signed)
Labs

## 2015-06-15 NOTE — Assessment & Plan Note (Signed)
Here for medicare wellness/physical  Diet: heart healthy  Physical activity: not sedentary  Depression/mood screen: negative  Hearing: intact to whispered voice  Visual acuity: grossly normal, performs annual eye exam  ADLs: capable  Fall risk: none  Home safety: good  Cognitive evaluation: intact to orientation, naming, recall and repetition  EOL planning: adv directives, full code/ I agree  I have personally reviewed and have noted  1. The patient's medical and social history  2. Their use of alcohol, tobacco or illicit drugs  3. Their current medications and supplements  4. The patient's functional ability including ADL's, fall risks, home safety risks and hearing or visual impairment.  5. Diet and physical activities  6. Evidence for depression or mood disorders    Today patient counseled on age appropriate routine health concerns for screening and prevention, each reviewed and up to date or declined. Immunizations reviewed and up to date or declined. Labs ordered and reviewed. Risk factors for depression reviewed and negative. Hearing function and visual acuity are intact. ADLs screened and addressed as needed. Functional ability and level of safety reviewed and appropriate. Education, counseling and referrals performed based on assessed risks today. Patient provided with a copy of personalized plan for preventive services.   She declined all shots, mammo, colonoscopy, GYN exam. Pt will see an Ophth

## 2015-06-15 NOTE — Progress Notes (Signed)
Pre visit review using our clinic review tool, if applicable. No additional management support is needed unless otherwise documented below in the visit note. 

## 2015-06-15 NOTE — Progress Notes (Signed)
Subjective:  Patient ID: Rachel Howard, female    DOB: 09-20-1945  Age: 70 y.o. MRN: [id number]  CC: No chief complaint on file.   HPI Rachel Howard presents for a well exam. F/u hypothyroidism  Outpatient Prescriptions Prior to Visit  Medication Sig Dispense Refill  . levothyroxine (SYNTHROID, LEVOTHROID) 100 MCG tablet Take 1 tablet (100 mcg total) by mouth daily. Must keep Rachel Howard appt for future refills 30 tablet 0  . aspirin 81 MG chewable tablet Chew 81 mg by mouth daily.    . Cholecalciferol (VITAMIN D3) 1000 UNITS tablet Take 1,000 Units by mouth daily. Reported on 4/Howard/2017    . ciprofloxacin (CIPRO) 250 MG tablet Take 1 tablet (250 mg total) by mouth 2 (two) times daily. (Patient not taking: Reported on 4/Howard/2017) 10 tablet 0  . clotrimazole-betamethasone (LOTRISONE) cream Apply topically 2 (two) times daily. (Patient not taking: Reported on 4/Howard/2017) 30 g 3  . loratadine (CLARITIN) 10 MG tablet Take 1 tablet (10 mg total) by mouth daily. 30 tablet 3   No facility-administered medications prior to visit.    ROS Review of Systems  Constitutional: Negative for chills, activity change, appetite change, fatigue and unexpected weight change.  HENT: Negative for congestion, mouth sores and sinus pressure.   Eyes: Negative for visual disturbance.  Respiratory: Negative for cough and chest tightness.   Gastrointestinal: Negative for nausea and abdominal pain.  Genitourinary: Negative for frequency, difficulty urinating and vaginal pain.  Musculoskeletal: Negative for back pain and gait problem.  Skin: Negative for pallor and rash.  Neurological: Negative for dizziness, tremors, weakness, numbness and headaches.  Psychiatric/Behavioral: Negative for suicidal ideas, confusion and sleep disturbance.    Objective:  BP 124/76 mmHg  Pulse 74  Ht  (1.575 m)  Wt 166 lb (75.297 kg)  BMI 30.35 kg/m2  SpO2 96%  BP Readings from Last 3 Encounters:    04/Howard/17 124/76  05/25/14 118/80  05/31/13 120/80    Wt Readings from Last 3 Encounters:  04/Howard/17 166 lb (75.297 kg)  05/25/14 171 lb (77.565 kg)  05/31/13 168 lb 8 oz (76.431 kg)    Physical Exam  Constitutional: She appears well-developed. No distress.  HENT:  Head: Normocephalic.  Right Ear: External ear normal.  Left Ear: External ear normal.  Nose: Nose normal.  Mouth/Throat: Oropharynx is clear and moist.  Eyes: Conjunctivae are normal. Pupils are equal, round, and reactive to light. Right eye exhibits no discharge. Left eye exhibits no discharge.  Neck: Normal range of motion. Neck supple. No JVD present. No tracheal deviation present. No thyromegaly present.  Cardiovascular: Normal rate, regular rhythm and normal heart sounds.   Pulmonary/Chest: No stridor. No respiratory distress. She has no wheezes.  Abdominal: Soft. Bowel sounds are normal. She exhibits no distension and no mass. There is no tenderness. There is no rebound and no guarding.  Musculoskeletal: She exhibits no edema or tenderness.  Lymphadenopathy:    She has no cervical adenopathy.  Neurological: She displays normal reflexes. No cranial nerve deficit. She exhibits normal muscle tone. Coordination normal.  Skin: No rash noted. No erythema.  Psychiatric: She has a normal mood and affect. Her behavior is normal. Judgment and thought content normal.  mild goiter SKs  Lab Results  Component Value Date   WBC 8.0 08/23/2014   HGB 15.1* 08/23/2014   HCT 44.5 08/23/2014   PLT 228.0 08/23/2014   GLUCOSE 111* 08/23/2014   CHOL 212* 08/23/2014   TRIG 179.0* 08/23/2014  HDL 47.30 08/23/2014   LDLDIRECT 157.9 05/04/2012   LDLCALC 129* 08/23/2014   ALT 14 08/23/2014   AST 17 08/23/2014   NA 141 08/23/2014   K 4.2 08/23/2014   CL 106 08/23/2014   CREATININE 0.89 08/23/2014   BUN Howard 08/23/2014   CO2 26 08/23/2014   TSH 0.10* 08/23/2014   INR 1.1 RATIO 04/07/2006    No results found.  Assessment &  Plan:   Diagnoses and all orders for this visit:  Well adult exam -     Hepatic function panel; Future -     Basic metabolic panel; Future -     CBC with Differential/Platelet; Future -     Lipid panel; Future -     TSH; Future -     Urinalysis; Future  Hypothyroidism due to acquired atrophy of thyroid  GOITER, MULTINODULAR -     US Soft Tissue Head/Neck  Other orders -     levothyroxine (SYNTHROID, LEVOTHROID) 100 MCG tablet; Take 1 tablet (100 mcg total) by mouth daily. Must keep Rachel Howard appt for future refills  I am having Rachel Howard maintain her cholecalciferol, aspirin, loratadine, clotrimazole-betamethasone, ciprofloxacin, and levothyroxine.  Meds ordered this encounter  Medications  . levothyroxine (SYNTHROID, LEVOTHROID) 100 MCG tablet    Sig: Take 1 tablet (100 mcg total) by mouth daily. Must keep Rachel Howard appt for future refills    Dispense:  30 tablet    Refill:  11     Follow-up: Return in about 1 year (around 4/Howard/2018) for Wellness Exam.  Sonda PrimesAlex Adler Alton, MD

## 2015-06-18 MED ORDER — LEVOTHYROXINE SODIUM 88 MCG PO TABS: 88.0000 ug | ORAL_TABLET | Freq: Every day | ORAL | Status: AC

## 2015-08-20 ENCOUNTER — Ambulatory Visit
Admission: RE | Admit: 2015-08-20 | Discharge: 2015-08-20 | Disposition: A | Discharge status: Home / Self Care | Payer: Medicare HMO | Source: Ambulatory Visit | Attending: Internal Medicine | Admitting: Internal Medicine

## 2015-08-20 DIAGNOSIS — E042 Nontoxic multinodular goiter: Secondary | ICD-10-CM | POA: Diagnosis not present

## 2015-12-18 ENCOUNTER — Telehealth: Payer: Self-pay | Admitting: Internal Medicine

## 2015-12-18 DIAGNOSIS — E039 Hypothyroidism, unspecified: Secondary | ICD-10-CM

## 2015-12-18 NOTE — Telephone Encounter (Signed)
Needs Labs Will sch Thx

## 2015-12-21 ENCOUNTER — Other Ambulatory Visit (INDEPENDENT_AMBULATORY_CARE_PROVIDER_SITE_OTHER): Payer: Medicare HMO

## 2015-12-21 DIAGNOSIS — E039 Hypothyroidism, unspecified: Secondary | ICD-10-CM | POA: Diagnosis not present

## 2015-12-21 LAB — BASIC METABOLIC PANEL
BUN: 21 mg/dL (ref 6–23)
CALCIUM: 9.2 mg/dL (ref 8.4–10.5)
CO2: 26 meq/L (ref 19–32)
CREATININE: 0.85 mg/dL (ref 0.40–1.20)
Chloride: 108 mEq/L (ref 96–112)
GFR: 70.1 mL/min (ref >60.00)
GLUCOSE: 116 mg/dL — AB (ref 70–99)
Potassium: 4.2 mEq/L (ref 3.5–5.1)
Sodium: 142 mEq/L (ref 135–145)

## 2015-12-24 LAB — T4, FREE: Free T4: 1.12 ng/dL (ref 0.60–1.60)

## 2015-12-24 LAB — TSH: TSH: 0.7 u[IU]/mL (ref 0.35–4.50)

## 2016-01-10 ENCOUNTER — Encounter (HOSPITAL_COMMUNITY): Payer: Self-pay | Admitting: Emergency Medicine

## 2016-01-10 ENCOUNTER — Emergency Department (HOSPITAL_COMMUNITY)
Admission: EM | Admit: 2016-01-10 | Discharge: 2016-01-10 | Disposition: A | Discharge status: Home / Self Care | Payer: Medicare HMO | Attending: Emergency Medicine | Admitting: Emergency Medicine

## 2016-01-10 DIAGNOSIS — Y929 Unspecified place or not applicable: Secondary | ICD-10-CM | POA: Diagnosis not present

## 2016-01-10 DIAGNOSIS — Y999 Unspecified external cause status: Secondary | ICD-10-CM | POA: Insufficient documentation

## 2016-01-10 DIAGNOSIS — Y939 Activity, unspecified: Secondary | ICD-10-CM | POA: Insufficient documentation

## 2016-01-10 DIAGNOSIS — S0101XA Laceration without foreign body of scalp, initial encounter: Secondary | ICD-10-CM | POA: Diagnosis not present

## 2016-01-10 DIAGNOSIS — W109XXA Fall (on) (from) unspecified stairs and steps, initial encounter: Secondary | ICD-10-CM | POA: Insufficient documentation

## 2016-01-10 NOTE — Discharge Instructions (Signed)
Please follow-up with her primary care provider in one week for staple removal. Please return immediately if any new or worsening signs or symptoms present.

## 2016-01-10 NOTE — ED Triage Notes (Signed)
Per pt states fell down a couple of stairs carrying a box-fell backwards-laceration to back of head-patient applying pressure to laceration-no LOC

## 2016-01-10 NOTE — ED Provider Notes (Signed)
[facility]-EMERGENCY DEPT Provider Note   CSN: [id number] Arrival date & time: 01/10/16  [id number]     History   Chief Complaint Chief Complaint  Patient presents with  . Head Laceration    HPI Rachel Howard is a 70 y.o. female.  HPI   70 year old female presents today with a laceration status post fall. Patient reports she was going down steps when she tripped and fell into a door. She reports a laceration to the posterior scalp, she denies any headache, neck pain, back pain, or any other injuries from the fall. Patient denies any neurological deficits, reports she is not on blood thinners. Reports her tetanus was updated 2 years ago.  Past Medical History:  Diagnosis Date  . Acute sinusitis, unspecified   . Nontoxic multinodular goiter   . Other specified acquired hypothyroidism   . Unspecified hypothyroidism     Patient Active Problem List   Diagnosis Date Noted  . Rash 09/29/2011  . Well adult exam 09/05/2010  . Seborrheic dermatitis 09/05/2010  . CERUMEN IMPACTION, RIGHT 10/02/2008  . GOITER, MULTINODULAR 05/03/2007  . Hypothyroidism 03/26/2007  . SINUSITIS, ACUTE 03/26/2007    History reviewed. No pertinent surgical history.  OB History    No data available       Home Medications    Prior to Admission medications   Medication Sig Start Date End Date Taking? Authorizing Provider  levothyroxine (SYNTHROID, LEVOTHROID) 88 MCG tablet Take 1 tablet (88 mcg total) by mouth daily. 06/18/15  Yes Tresa GarterAleksei V Plotnikov, MD    Family History Family History  Problem Relation Age of Onset  . Stroke Mother   . Thyroid disease Neg Hx     Social History Social History  Substance Use Topics  . Smoking status: Never Smoker  . Smokeless tobacco: Not on file  . Alcohol use No     Allergies   Patient has no known allergies.   Review of Systems Review of Systems  All other systems reviewed and are negative.    Physical Exam Updated Vital Signs BP 159/88  (BP Location: Right Arm)   Pulse 82   Temp 98.4 F (36.9 C) (Oral)   Resp 18   SpO2 100%   Physical Exam  Constitutional: She is oriented to person, place, and time. She appears well-developed and well-nourished.  HENT:  Head: Normocephalic.  2 cm laceration to the posterior scalp, no surrounding bony tenderness  Eyes: Conjunctivae are normal. Pupils are equal, round, and reactive to light. Right eye exhibits no discharge. Left eye exhibits no discharge. No scleral icterus.  Neck: Normal range of motion. No JVD present. No tracheal deviation present.  Pulmonary/Chest: Effort normal. No stridor.  Musculoskeletal:  No CT or L-spine tenderness to full active range of motion pain-free.  Neurological: She is alert and oriented to person, place, and time. No cranial nerve deficit or sensory deficit. Coordination normal.  Psychiatric: She has a normal mood and affect. Her behavior is normal. Judgment and thought content normal.  Nursing note and vitals reviewed.   ED Treatments / Results  Labs (all labs ordered are listed, but only abnormal results are displayed) Labs Reviewed - No data to display  EKG  EKG Interpretation None       Radiology No results found.  Procedures Procedures (including critical care time)  LACERATION REPAIR Performed by: Thermon LeylandHedges,Jossalin Chervenak Todd Authorized by: Thermon LeylandHedges,Dagmawi Venable Todd Consent: Verbal consent obtained. Risks and benefits: risks, benefits and alternatives were discussed Consent given by: patient Patient identity  confirmed: provided demographic data Prepped and Draped in normal sterile fashion Wound explored  Laceration Location: Posterior scalp  Laceration Length: 2 cm  No Foreign Bodies seen or palpated  Irrigation method: syringe Amount of cleaning: standard  Skin closure: Staples   Number of staples - 3   Technique: Simple   Patient tolerance: Patient tolerated the procedure well with no immediate complications.  Medications  Ordered in ED Medications - No data to display   Initial Impression / Assessment and Plan / ED Course  I have reviewed the triage vital signs and the nursing notes.  Pertinent labs & imaging results that were available during my care of the patient were reviewed by me and considered in my medical decision making (see chart for details).  Clinical Course    Labs:  Imaging:  Consults:  Therapeutics:  Discharge Meds:   Assessment/Plan: 70 year old female presents status post fall with a laceration to the posterior scalp. She has no signs of skull fracture, she has no signs of intracranial abnormality. She is very well appearing in no acute distress. She has no neck tenderness or pain. No need for imaging at this time. Not on blood thinners. Patient's laceration was repaired with staples here in the ED, she'll be discharged home with symptomatic care instructions and strict repercussions. She verbalizes understanding and agreement to today's plan had no further questions or concerns    Final Clinical Impressions(s) / ED Diagnoses   Final diagnoses:  Laceration of scalp, initial encounter    New Prescriptions New Prescriptions   No medications on file     Eyvonne MechanicJeffrey Rourke Mcquitty, PA-C 01/10/16 1113    Raeford RazorStephen Kohut, MD 01/12/16 1118

## 2016-02-05 DIAGNOSIS — Z8719 Personal history of other diseases of the digestive system: Secondary | ICD-10-CM | POA: Diagnosis not present

## 2016-02-05 DIAGNOSIS — Z4802 Encounter for removal of sutures: Secondary | ICD-10-CM | POA: Diagnosis not present

## 2016-07-11 ENCOUNTER — Telehealth: Payer: Self-pay

## 2016-07-11 NOTE — Telephone Encounter (Signed)
Patient is on the list for [employer] 2018 and may be a good candidate for an AWV. Please let me know if/when appt is scheduled.   

## 2016-07-30 DIAGNOSIS — E669 Obesity, unspecified: Secondary | ICD-10-CM | POA: Diagnosis not present

## 2016-07-30 DIAGNOSIS — E042 Nontoxic multinodular goiter: Secondary | ICD-10-CM | POA: Diagnosis not present

## 2016-07-30 DIAGNOSIS — E041 Nontoxic single thyroid nodule: Secondary | ICD-10-CM | POA: Diagnosis not present

## 2016-07-30 DIAGNOSIS — E039 Hypothyroidism, unspecified: Secondary | ICD-10-CM | POA: Diagnosis not present

## 2016-07-30 DIAGNOSIS — E785 Hyperlipidemia, unspecified: Secondary | ICD-10-CM | POA: Diagnosis not present

## 2016-08-04 DIAGNOSIS — Z0000 Encounter for general adult medical examination without abnormal findings: Secondary | ICD-10-CM | POA: Diagnosis not present

## 2016-08-04 DIAGNOSIS — E785 Hyperlipidemia, unspecified: Secondary | ICD-10-CM | POA: Diagnosis not present

## 2016-08-04 DIAGNOSIS — E039 Hypothyroidism, unspecified: Secondary | ICD-10-CM | POA: Diagnosis not present

## 2016-08-20 DIAGNOSIS — E785 Hyperlipidemia, unspecified: Secondary | ICD-10-CM | POA: Diagnosis not present

## 2016-08-20 DIAGNOSIS — E042 Nontoxic multinodular goiter: Secondary | ICD-10-CM | POA: Diagnosis not present

## 2017-08-28 IMAGING — US US SOFT TISSUE HEAD/NECK
1 series · 13 of 25 positions shown · non-contrast
Comparison: 04/01/07

CLINICAL DATA: Multinodular goiter

EXAM:
THYROID ULTRASOUND
TECHNIQUE: Ultrasound examination of the thyroid gland and adjacent soft
tissues was performed.

[Series 1: us soft tissue head/neck · 0.05mm/px · 13 of 49 slices shown]
[im 1/49]
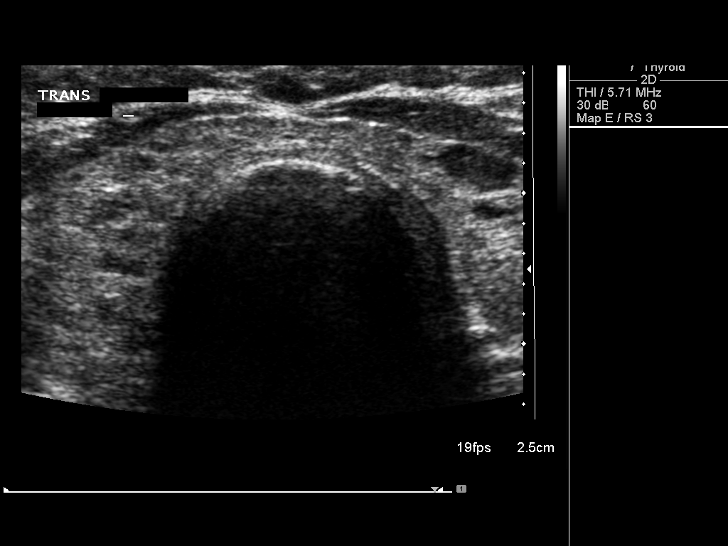
[im 5/49]
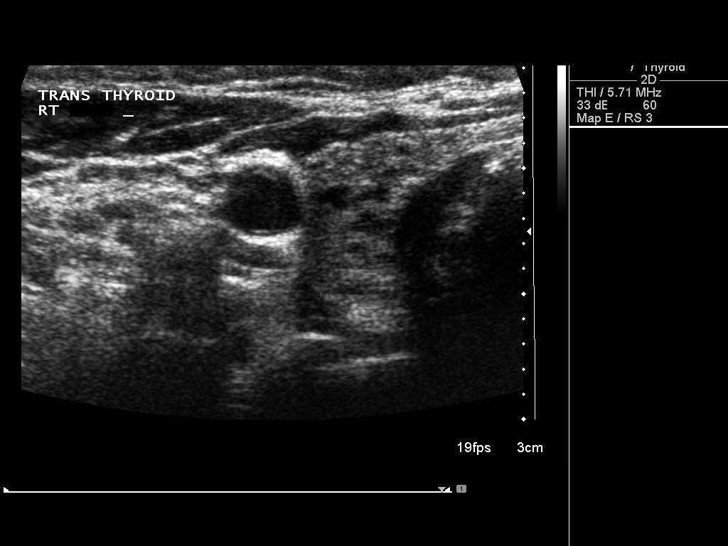
[im 9/49]
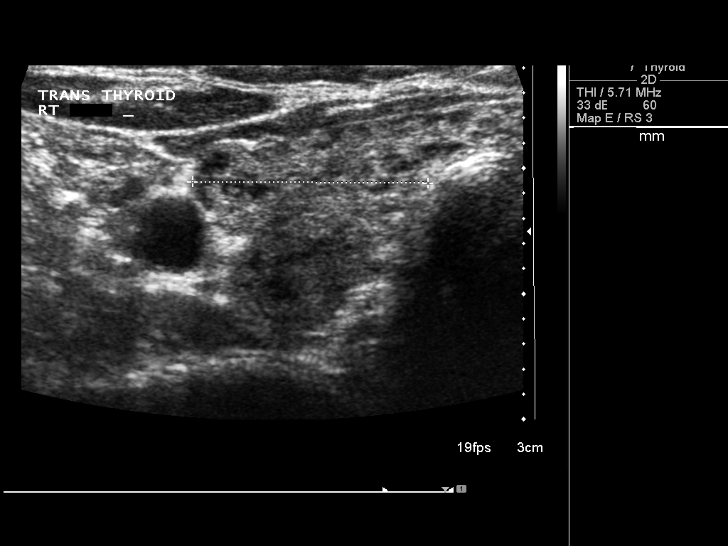
[im 13/49]
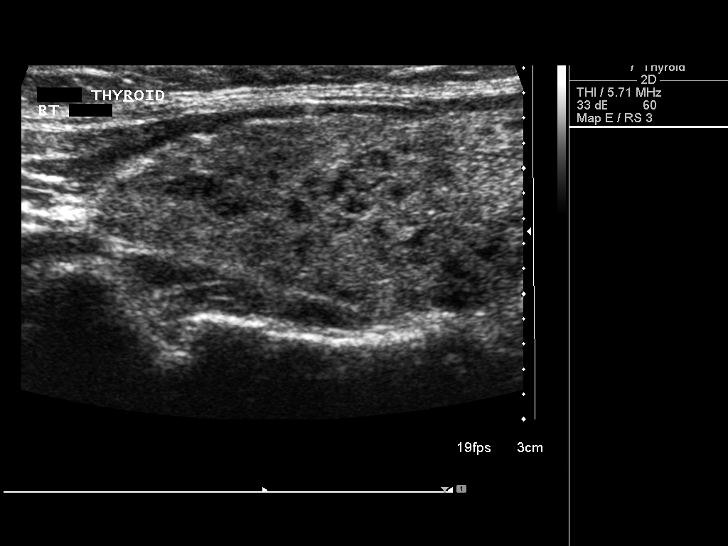
[im 17/49]
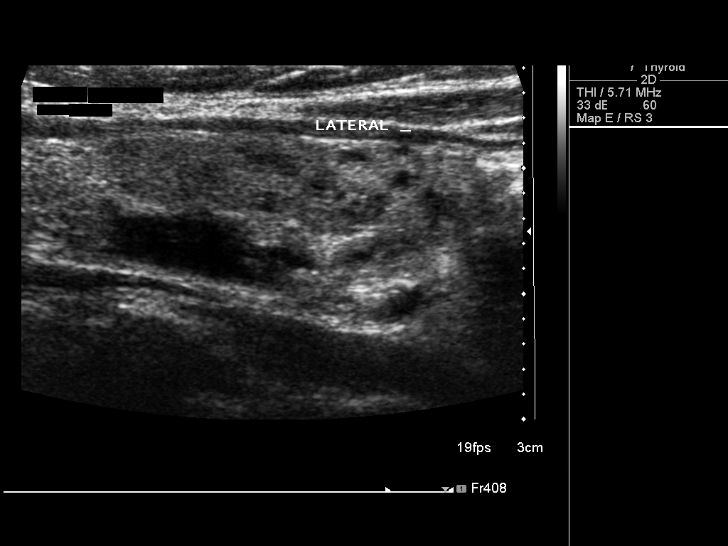
[im 21/49]
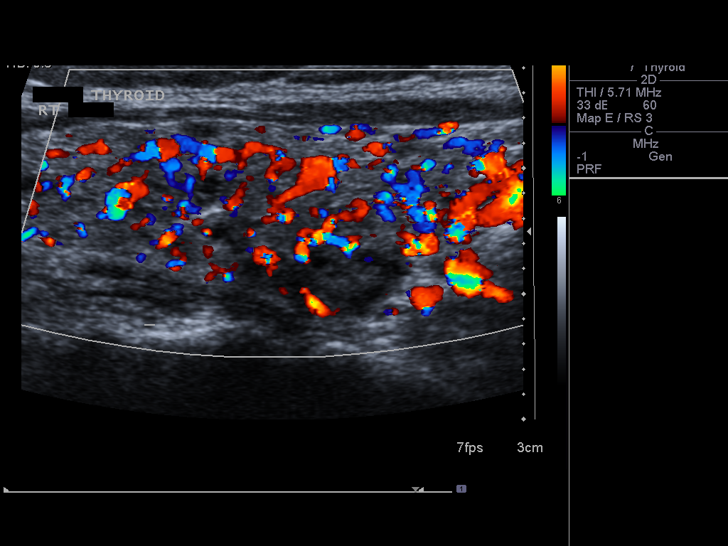
[im 25/49]
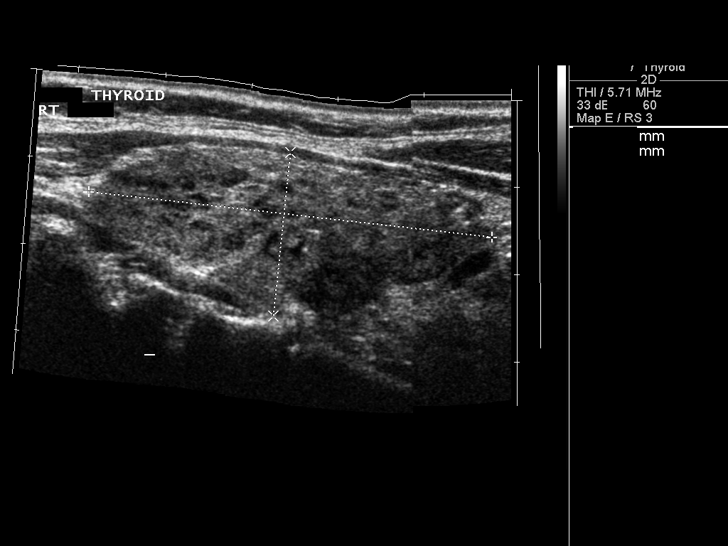
[im 29/49]
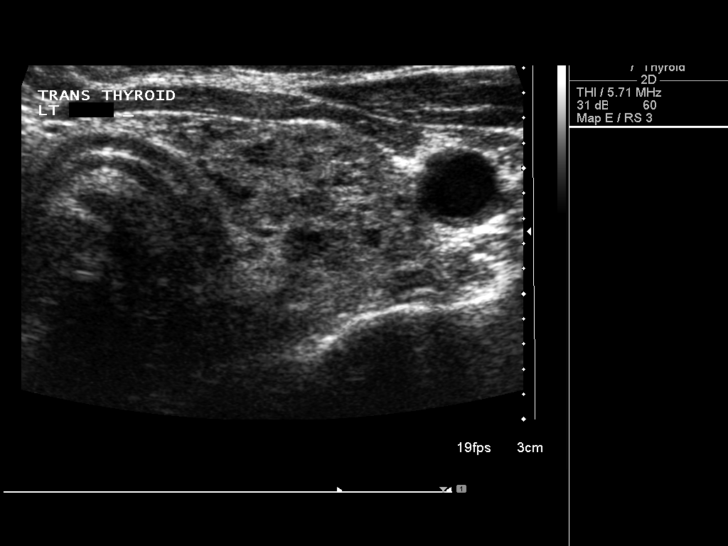
[im 33/49]
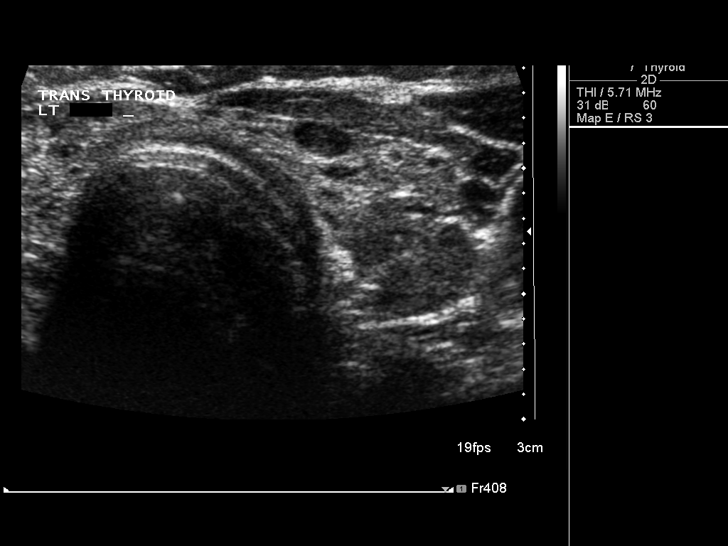
[im 37/49]
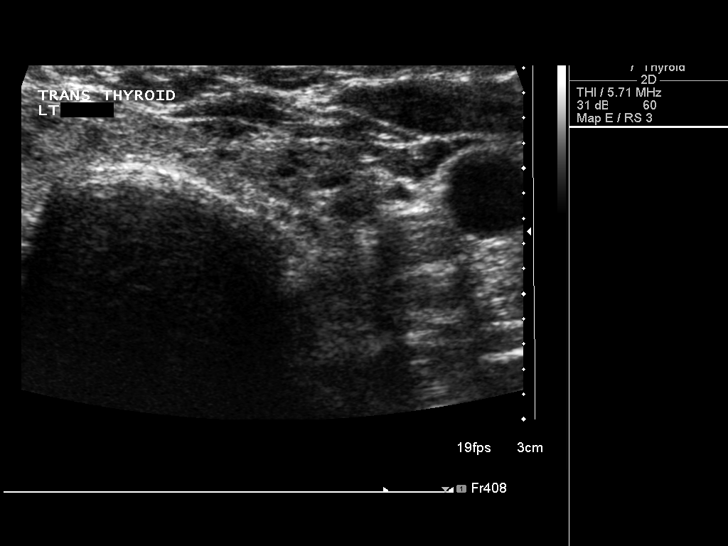
[im 41/49]
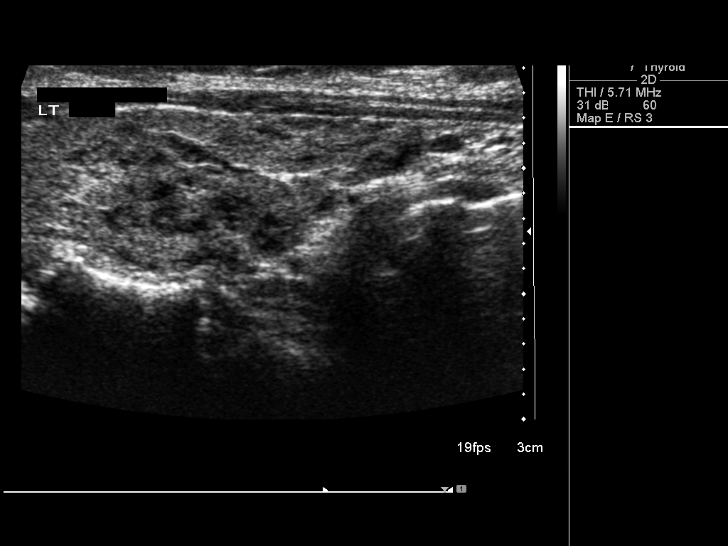
[im 45/49]
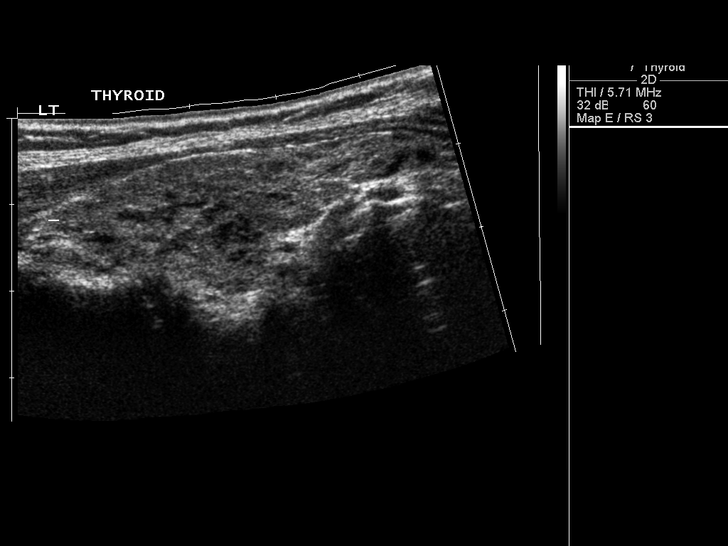
[im 49/49]
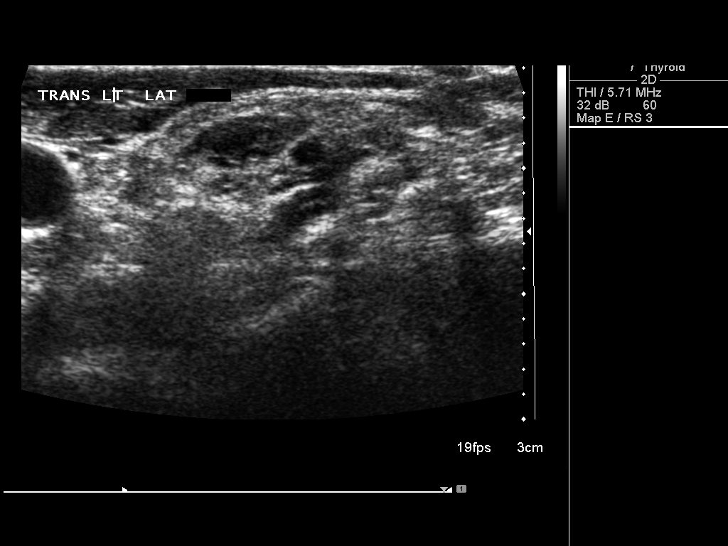

[13 of 25 positions shown; findings below may reference images not displayed]

FINDINGS: Right thyroid lobe

Measurements: 4.7 x 1.9 x 1.9 cm. Diffuse heterogeneity is noted
with multiple small nodules identified.

Left thyroid lobe

Measurements: 4.7 x 1.7 x 1.6 cm. Diffuse heterogeneity is again
noted with multiple subcentimeter hypoechoic regions. A focal 5 mm
hypoechoic nodule is again seen and stable from the prior exam. No
new focal abnormality is seen.

Isthmus

Thickness: 0.4 cm. In the isthmus eccentric to the right, there is
an 0.8 x 0.5 cm somewhat hypoechoic nodule. This is new from the
prior exam.

Lymphadenopathy

None visualized.
IMPRESSION: Stable changes in the left thyroid lobe.

New 0.8 cm nodule within the isthmus. Findings do not meet current
SRU consensus criteria for biopsy. Follow-up by clinical exam is
recommended. If patient has known risk factors for thyroid
carcinoma, consider follow-up ultrasound in 12 months. If patient is
clinically hyperthyroid, consider nuclear medicine thyroid uptake
and scan.Reference: Management of Thyroid Nodules Detected at US:
Society of Radiologists in Ultrasound Consensus Conference
